# Patient Record
Sex: Male | Born: 1945
Health system: Southern US, Community
[De-identification: ages and names within clinical notes are randomized; demographics above are authoritative.]

## PROBLEM LIST (undated history)

## (undated) DIAGNOSIS — R112 Nausea with vomiting, unspecified: Secondary | ICD-10-CM

## (undated) DIAGNOSIS — T8859XA Other complications of anesthesia, initial encounter: Secondary | ICD-10-CM

## (undated) DIAGNOSIS — Z9889 Other specified postprocedural states: Secondary | ICD-10-CM

## (undated) DIAGNOSIS — J449 Chronic obstructive pulmonary disease, unspecified: Secondary | ICD-10-CM

## (undated) DIAGNOSIS — E119 Type 2 diabetes mellitus without complications: Secondary | ICD-10-CM

## (undated) DIAGNOSIS — G510 Bell's palsy: Secondary | ICD-10-CM

## (undated) DIAGNOSIS — T4145XA Adverse effect of unspecified anesthetic, initial encounter: Secondary | ICD-10-CM

## (undated) DIAGNOSIS — E039 Hypothyroidism, unspecified: Secondary | ICD-10-CM

## (undated) DIAGNOSIS — R000 Tachycardia, unspecified: Secondary | ICD-10-CM

## (undated) HISTORY — PX: DENTAL SURGERY: SHX609

## (undated) HISTORY — PX: COLONOSCOPY W/ POLYPECTOMY: SHX1380

## (undated) HISTORY — PX: EYE SURGERY: SHX253

## (undated) HISTORY — PX: OTHER SURGICAL HISTORY: SHX169

## (undated) HISTORY — PX: COLONOSCOPY: SHX174

---

## 2006-08-28 ENCOUNTER — Encounter: Admission: RE | Admit: 2006-08-28 | Discharge: 2006-08-28 | Payer: Self-pay | Admitting: Internal Medicine

## 2007-04-01 ENCOUNTER — Encounter (INDEPENDENT_AMBULATORY_CARE_PROVIDER_SITE_OTHER): Payer: Self-pay | Admitting: Diagnostic Radiology

## 2007-04-01 ENCOUNTER — Ambulatory Visit (HOSPITAL_COMMUNITY): Admission: RE | Admit: 2007-04-01 | Discharge: 2007-04-01 | Payer: Self-pay | Admitting: Urology

## 2009-02-14 ENCOUNTER — Encounter: Admission: RE | Admit: 2009-02-14 | Discharge: 2009-02-14 | Payer: Self-pay | Admitting: Internal Medicine

## 2009-02-14 IMAGING — US US ABDOMEN COMPLETE
1 series · 14 of 25 positions shown · non-contrast
Comparison: None

CLINICAL DATA: Abdominal pain.  Question gallbladder disease.
Known renal cyst.

COMPLETE ABDOMINAL ULTRASOUND

[Series 1: us abdomen complete · 0.37mm/px · 14 of 78 slices shown]
[im 1/78]
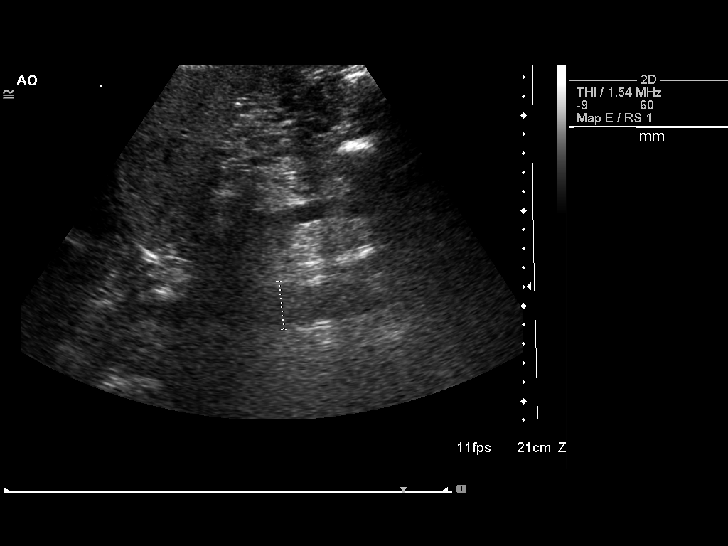
[im 7/78]
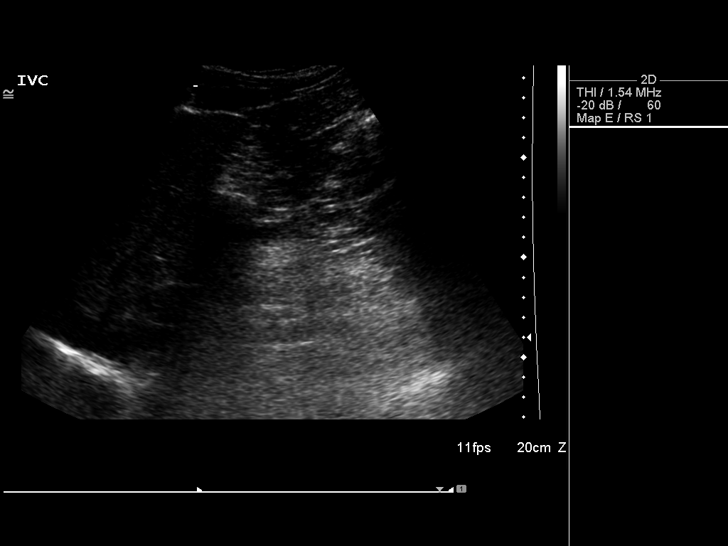
[im 13/78]
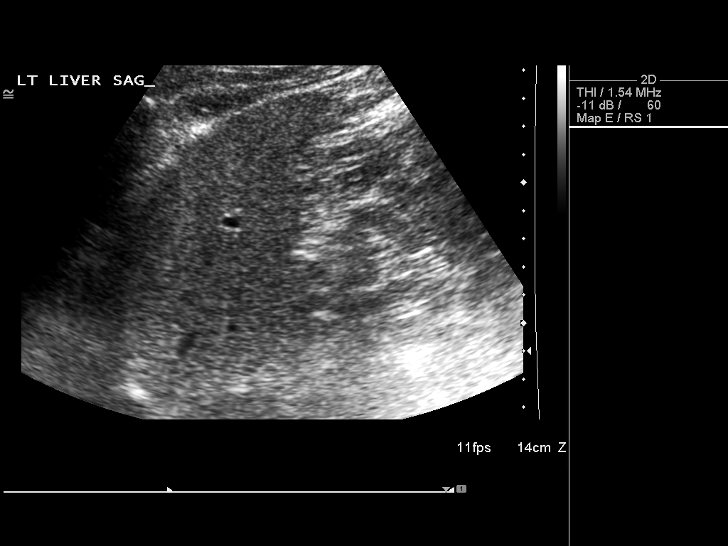
[im 20/78]
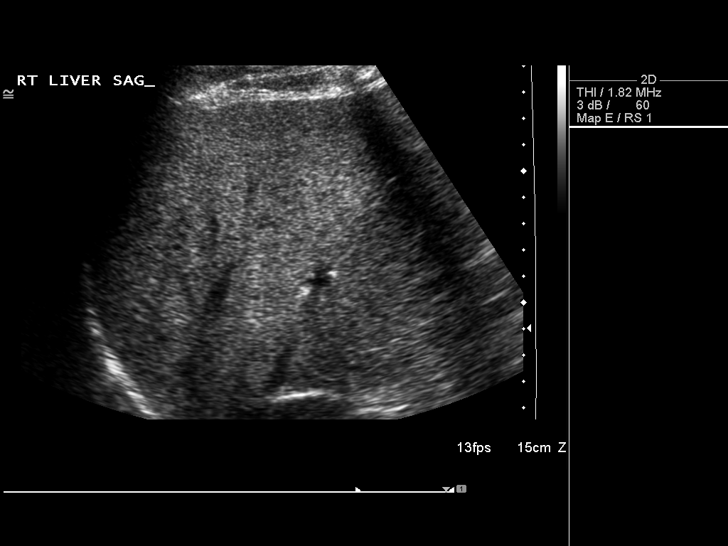
[im 26/78]
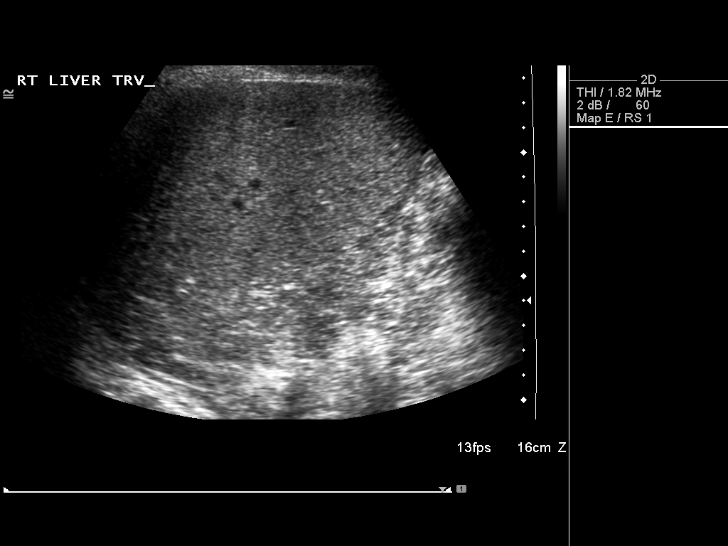
[im 29/78]
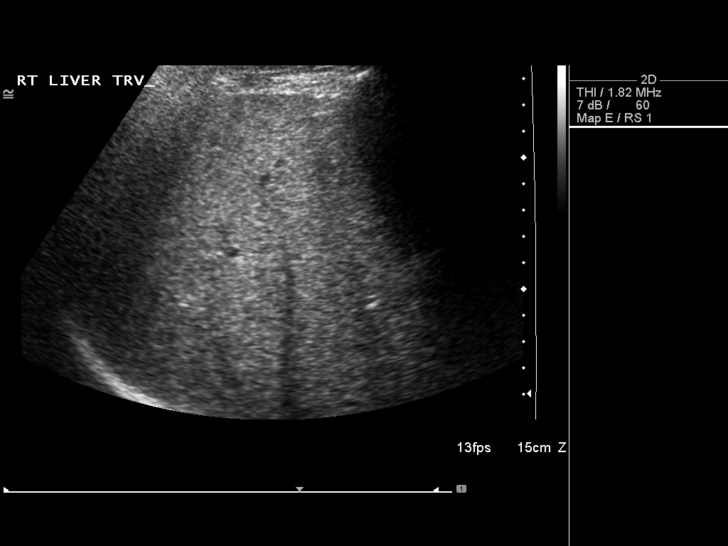
[im 36/78]
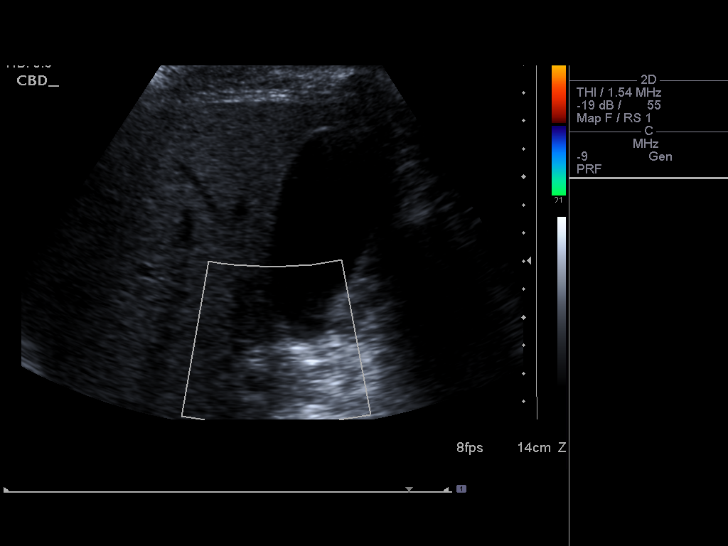
[im 42/78]
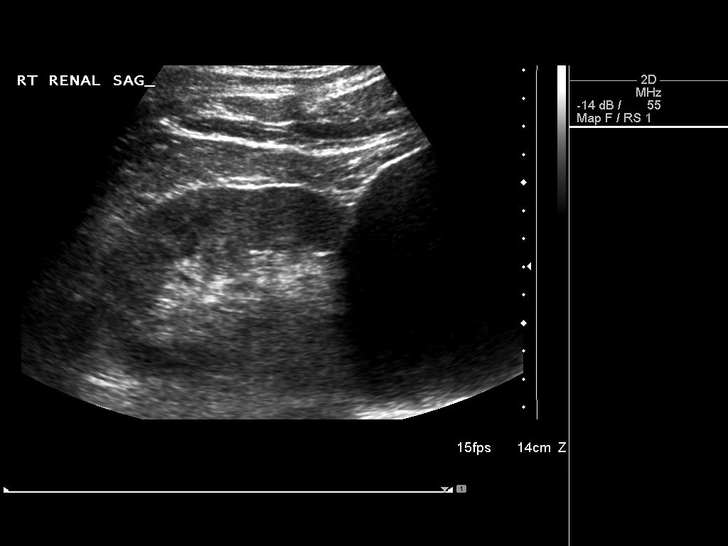
[im 49/78]
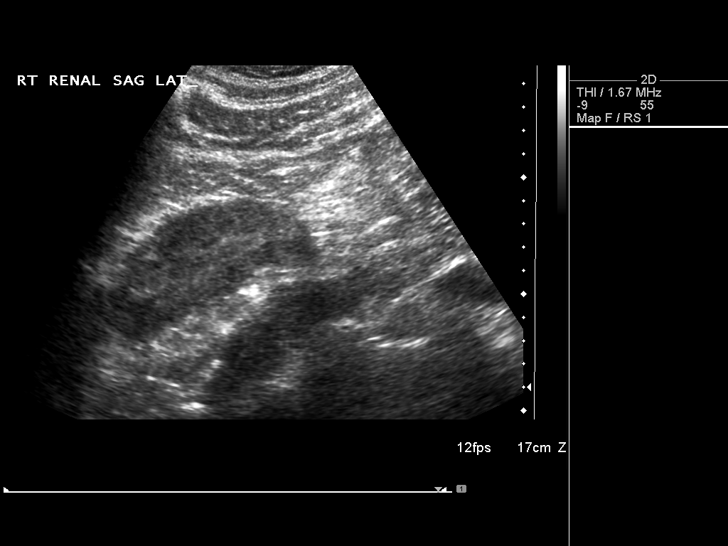
[im 52/78]
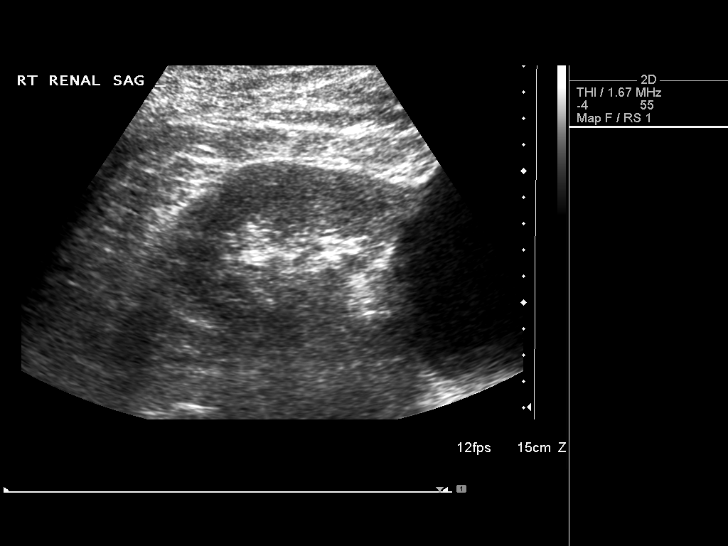
[im 58/78]
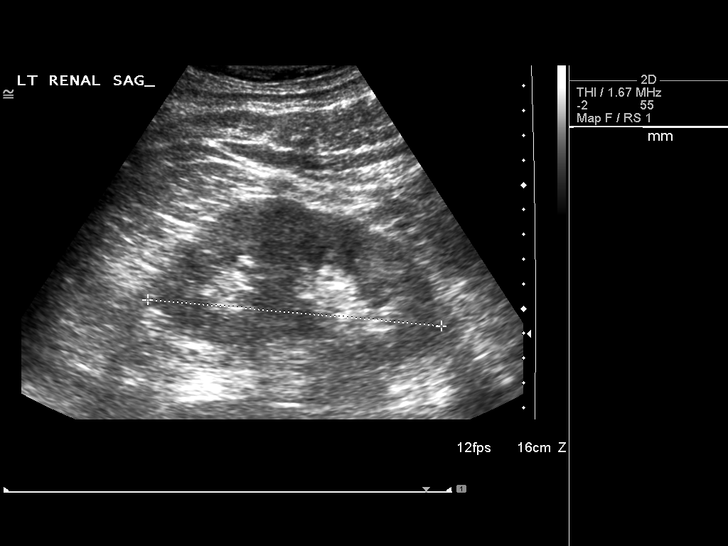
[im 65/78]
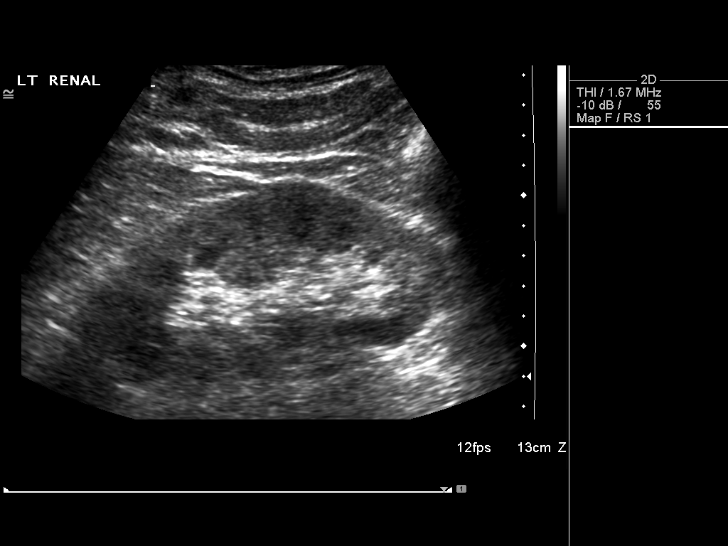
[im 71/78]
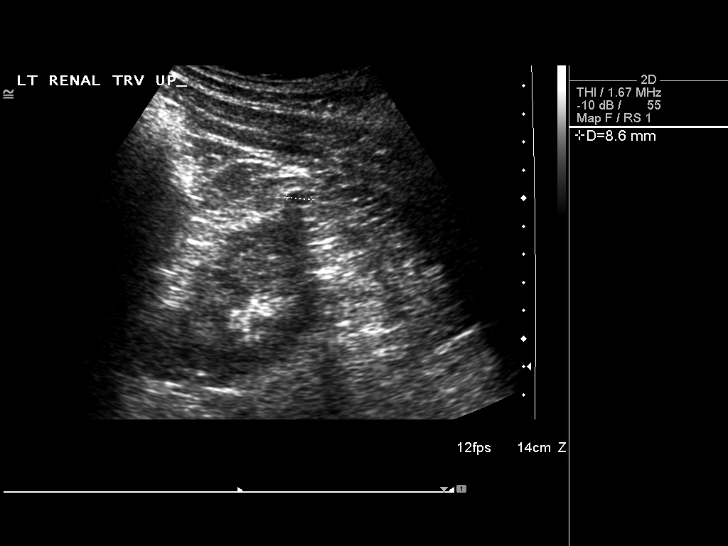
[im 78/78]
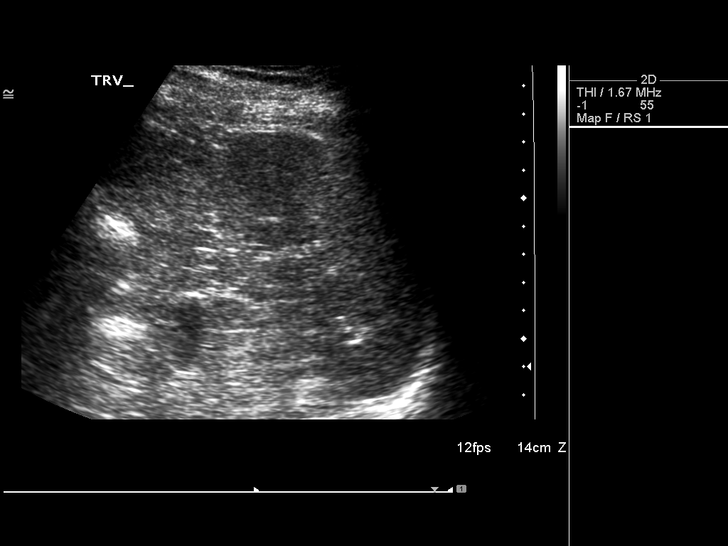

[14 of 25 positions shown; findings below may reference images not displayed]

FINDINGS: Normal gallbladder.  Gallbladder wall thickness is
mm.  Common duct measures 3.0 mm in diameter.  No hepatic, splenic,
or pancreatic abnormality.  Spleen measures 8.5 cm in length.  The
right and left kidneys measure 11.4 cm and 12.5 cm in length,
respectively.  8.5 x 9.7 x 7.9 cm cyst in the lower pole of the
right kidney.  This compares to 8.6 cm in diameter on the
[DATE] exam.  [DATE] x 2.1 x 1.9 cm upper pole cyst and 0.9 x
x 0.9 cm upper pole cyst of the left kidney.  Patent IVC.  Maximal
diameter abdominal aorta is 2.6 cm.
IMPRESSION: Normal gallbladder.  No biliary ductal dilatation.  Stable
bilateral renal cysts.

## 2009-04-27 ENCOUNTER — Ambulatory Visit (HOSPITAL_COMMUNITY): Admission: RE | Admit: 2009-04-27 | Discharge: 2009-04-27 | Payer: Self-pay | Admitting: Urology

## 2010-10-27 ENCOUNTER — Encounter: Payer: Self-pay | Admitting: Internal Medicine

## 2011-01-12 LAB — CBC
HCT: 47.1 % (ref 39.0–52.0)
Hemoglobin: 16.1 g/dL (ref 13.0–17.0)
MCHC: 34.2 g/dL (ref 30.0–36.0)
MCV: 86.7 fL (ref 78.0–100.0)
Platelets: 314 10*3/uL (ref 150–400)
RBC: 5.43 MIL/uL (ref 4.22–5.81)
RDW: 13.3 % (ref 11.5–15.5)
WBC: 9.2 10*3/uL (ref 4.0–10.5)

## 2011-07-23 LAB — CBC
HCT: 47.2
Hemoglobin: 16.4
MCHC: 34.8
MCV: 85
Platelets: 321
RBC: 5.56
RDW: 13.2
WBC: 8.8

## 2016-03-11 DIAGNOSIS — J209 Acute bronchitis, unspecified: Secondary | ICD-10-CM | POA: Diagnosis not present

## 2016-03-11 DIAGNOSIS — Z6831 Body mass index (BMI) 31.0-31.9, adult: Secondary | ICD-10-CM | POA: Diagnosis not present

## 2016-05-29 DIAGNOSIS — E668 Other obesity: Secondary | ICD-10-CM | POA: Diagnosis not present

## 2016-05-29 DIAGNOSIS — N529 Male erectile dysfunction, unspecified: Secondary | ICD-10-CM | POA: Diagnosis not present

## 2016-05-29 DIAGNOSIS — G510 Bell's palsy: Secondary | ICD-10-CM | POA: Diagnosis not present

## 2016-05-29 DIAGNOSIS — E119 Type 2 diabetes mellitus without complications: Secondary | ICD-10-CM | POA: Diagnosis not present

## 2016-11-24 DIAGNOSIS — E038 Other specified hypothyroidism: Secondary | ICD-10-CM | POA: Diagnosis not present

## 2016-11-24 DIAGNOSIS — Z125 Encounter for screening for malignant neoplasm of prostate: Secondary | ICD-10-CM | POA: Diagnosis not present

## 2016-11-24 DIAGNOSIS — E119 Type 2 diabetes mellitus without complications: Secondary | ICD-10-CM | POA: Diagnosis not present

## 2016-11-24 DIAGNOSIS — Z0000 Encounter for general adult medical examination without abnormal findings: Secondary | ICD-10-CM | POA: Diagnosis not present

## 2016-12-01 DIAGNOSIS — Z0000 Encounter for general adult medical examination without abnormal findings: Secondary | ICD-10-CM | POA: Diagnosis not present

## 2016-12-01 DIAGNOSIS — Z1389 Encounter for screening for other disorder: Secondary | ICD-10-CM | POA: Diagnosis not present

## 2016-12-01 DIAGNOSIS — N529 Male erectile dysfunction, unspecified: Secondary | ICD-10-CM | POA: Diagnosis not present

## 2016-12-01 DIAGNOSIS — G510 Bell's palsy: Secondary | ICD-10-CM | POA: Diagnosis not present

## 2016-12-01 DIAGNOSIS — E7800 Pure hypercholesterolemia, unspecified: Secondary | ICD-10-CM | POA: Diagnosis not present

## 2016-12-01 DIAGNOSIS — E668 Other obesity: Secondary | ICD-10-CM | POA: Diagnosis not present

## 2016-12-03 DIAGNOSIS — Z1212 Encounter for screening for malignant neoplasm of rectum: Secondary | ICD-10-CM | POA: Diagnosis not present

## 2017-06-03 DIAGNOSIS — E7800 Pure hypercholesterolemia, unspecified: Secondary | ICD-10-CM | POA: Diagnosis not present

## 2017-06-03 DIAGNOSIS — E119 Type 2 diabetes mellitus without complications: Secondary | ICD-10-CM | POA: Diagnosis not present

## 2017-06-03 DIAGNOSIS — E038 Other specified hypothyroidism: Secondary | ICD-10-CM | POA: Diagnosis not present

## 2017-06-03 DIAGNOSIS — J449 Chronic obstructive pulmonary disease, unspecified: Secondary | ICD-10-CM | POA: Diagnosis not present

## 2017-11-26 DIAGNOSIS — Z125 Encounter for screening for malignant neoplasm of prostate: Secondary | ICD-10-CM | POA: Diagnosis not present

## 2017-11-26 DIAGNOSIS — R82998 Other abnormal findings in urine: Secondary | ICD-10-CM | POA: Diagnosis not present

## 2017-11-26 DIAGNOSIS — E119 Type 2 diabetes mellitus without complications: Secondary | ICD-10-CM | POA: Diagnosis not present

## 2017-11-26 DIAGNOSIS — E038 Other specified hypothyroidism: Secondary | ICD-10-CM | POA: Diagnosis not present

## 2017-11-26 DIAGNOSIS — Z0000 Encounter for general adult medical examination without abnormal findings: Secondary | ICD-10-CM | POA: Diagnosis not present

## 2017-12-03 DIAGNOSIS — E668 Other obesity: Secondary | ICD-10-CM | POA: Diagnosis not present

## 2017-12-03 DIAGNOSIS — N528 Other male erectile dysfunction: Secondary | ICD-10-CM | POA: Diagnosis not present

## 2017-12-03 DIAGNOSIS — Z0000 Encounter for general adult medical examination without abnormal findings: Secondary | ICD-10-CM | POA: Diagnosis not present

## 2017-12-03 DIAGNOSIS — E038 Other specified hypothyroidism: Secondary | ICD-10-CM | POA: Diagnosis not present

## 2017-12-03 DIAGNOSIS — D126 Benign neoplasm of colon, unspecified: Secondary | ICD-10-CM | POA: Diagnosis not present

## 2017-12-03 DIAGNOSIS — Z1389 Encounter for screening for other disorder: Secondary | ICD-10-CM | POA: Diagnosis not present

## 2017-12-16 DIAGNOSIS — Z6831 Body mass index (BMI) 31.0-31.9, adult: Secondary | ICD-10-CM | POA: Diagnosis not present

## 2017-12-16 DIAGNOSIS — J441 Chronic obstructive pulmonary disease with (acute) exacerbation: Secondary | ICD-10-CM | POA: Diagnosis not present

## 2018-02-16 DIAGNOSIS — K5904 Chronic idiopathic constipation: Secondary | ICD-10-CM | POA: Diagnosis not present

## 2018-02-16 DIAGNOSIS — Z800 Family history of malignant neoplasm of digestive organs: Secondary | ICD-10-CM | POA: Diagnosis not present

## 2018-02-16 DIAGNOSIS — Z1211 Encounter for screening for malignant neoplasm of colon: Secondary | ICD-10-CM | POA: Diagnosis not present

## 2018-02-19 ENCOUNTER — Other Ambulatory Visit: Payer: Self-pay | Admitting: Gastroenterology

## 2018-02-19 DIAGNOSIS — Z800 Family history of malignant neoplasm of digestive organs: Secondary | ICD-10-CM | POA: Diagnosis not present

## 2018-02-19 DIAGNOSIS — K635 Polyp of colon: Secondary | ICD-10-CM | POA: Diagnosis not present

## 2018-02-19 DIAGNOSIS — Z1211 Encounter for screening for malignant neoplasm of colon: Secondary | ICD-10-CM | POA: Diagnosis not present

## 2018-02-19 DIAGNOSIS — R933 Abnormal findings on diagnostic imaging of other parts of digestive tract: Secondary | ICD-10-CM

## 2018-02-19 DIAGNOSIS — D124 Benign neoplasm of descending colon: Secondary | ICD-10-CM | POA: Diagnosis not present

## 2018-03-08 DIAGNOSIS — H401132 Primary open-angle glaucoma, bilateral, moderate stage: Secondary | ICD-10-CM | POA: Diagnosis not present

## 2018-03-25 ENCOUNTER — Other Ambulatory Visit: Payer: Self-pay

## 2018-03-25 ENCOUNTER — Ambulatory Visit
Admission: RE | Admit: 2018-03-25 | Discharge: 2018-03-25 | Disposition: A | Discharge status: Home / Self Care | Payer: BLUE CROSS/BLUE SHIELD | Source: Ambulatory Visit | Attending: Gastroenterology | Admitting: Gastroenterology

## 2018-03-25 DIAGNOSIS — N281 Cyst of kidney, acquired: Secondary | ICD-10-CM | POA: Diagnosis not present

## 2018-03-25 DIAGNOSIS — R933 Abnormal findings on diagnostic imaging of other parts of digestive tract: Secondary | ICD-10-CM

## 2018-03-25 MED ORDER — IOPAMIDOL (ISOVUE-300) INJECTION 61%
75.0000 mL | Freq: Once | INTRAVENOUS | Status: AC | PRN
Start: 2018-03-25 — End: 2018-03-25
  Administered 2018-03-25: 75 mL via INTRAVENOUS

## 2018-04-12 DIAGNOSIS — H40003 Preglaucoma, unspecified, bilateral: Secondary | ICD-10-CM | POA: Diagnosis not present

## 2018-04-12 DIAGNOSIS — H524 Presbyopia: Secondary | ICD-10-CM | POA: Diagnosis not present

## 2018-04-12 DIAGNOSIS — H401333 Pigmentary glaucoma, bilateral, severe stage: Secondary | ICD-10-CM | POA: Diagnosis not present

## 2018-04-12 DIAGNOSIS — H35372 Puckering of macula, left eye: Secondary | ICD-10-CM | POA: Diagnosis not present

## 2018-04-12 DIAGNOSIS — Z961 Presence of intraocular lens: Secondary | ICD-10-CM | POA: Diagnosis not present

## 2018-04-12 DIAGNOSIS — H5213 Myopia, bilateral: Secondary | ICD-10-CM | POA: Diagnosis not present

## 2018-04-12 DIAGNOSIS — H401133 Primary open-angle glaucoma, bilateral, severe stage: Secondary | ICD-10-CM | POA: Diagnosis not present

## 2018-04-14 ENCOUNTER — Other Ambulatory Visit: Payer: Self-pay | Admitting: Surgery

## 2018-04-14 DIAGNOSIS — K388 Other specified diseases of appendix: Secondary | ICD-10-CM | POA: Diagnosis not present

## 2018-05-10 DIAGNOSIS — K4090 Unilateral inguinal hernia, without obstruction or gangrene, not specified as recurrent: Secondary | ICD-10-CM | POA: Diagnosis not present

## 2018-05-10 DIAGNOSIS — K388 Other specified diseases of appendix: Secondary | ICD-10-CM | POA: Diagnosis not present

## 2018-05-10 DIAGNOSIS — J069 Acute upper respiratory infection, unspecified: Secondary | ICD-10-CM | POA: Diagnosis not present

## 2018-05-10 DIAGNOSIS — Z6830 Body mass index (BMI) 30.0-30.9, adult: Secondary | ICD-10-CM | POA: Diagnosis not present

## 2018-05-13 DIAGNOSIS — Z79899 Other long term (current) drug therapy: Secondary | ICD-10-CM | POA: Diagnosis not present

## 2018-05-13 DIAGNOSIS — H401333 Pigmentary glaucoma, bilateral, severe stage: Secondary | ICD-10-CM | POA: Diagnosis not present

## 2018-06-01 DIAGNOSIS — J449 Chronic obstructive pulmonary disease, unspecified: Secondary | ICD-10-CM | POA: Diagnosis not present

## 2018-06-01 DIAGNOSIS — G4733 Obstructive sleep apnea (adult) (pediatric): Secondary | ICD-10-CM | POA: Diagnosis not present

## 2018-06-01 DIAGNOSIS — E119 Type 2 diabetes mellitus without complications: Secondary | ICD-10-CM | POA: Diagnosis not present

## 2018-06-01 DIAGNOSIS — E038 Other specified hypothyroidism: Secondary | ICD-10-CM | POA: Diagnosis not present

## 2018-06-02 NOTE — Pre-Procedure Instructions (Signed)
William Dawson  06/02/2018      [facility] - [place], [place] - [address] [place] [address] Phone: [phone] Fax: [phone]    Your procedure is scheduled on   Thursday  06/10/18  Report to [facility] Admitting at 830 A.M.  Call this number if you have problems the morning of surgery:  [phone]   Remember:  Do not eat or drink after midnight.    Take these medicines the morning of surgery with A SIP OF WATER-   ATENOLOL (TENORMIN), SYMBICORT INHALER, LEVOTHYROXINE  7 days prior to surgery STOP taking any Aspirin(unless otherwise instructed by your surgeon), Aleve, Naproxen, Ibuprofen, Motrin, Advil, Goody's, BC's, all herbal medications, fish oil, and all vitamins      How to Manage Your Diabetes Before and After Surgery  Why is it important to control my blood sugar before and after surgery? . Improving blood sugar levels before and after surgery helps healing and can limit problems. . A way of improving blood sugar control is eating a healthy diet by: o  Eating less sugar and carbohydrates o  Increasing activity/exercise o  Talking with your doctor about reaching your blood sugar goals . High blood sugars (greater than 180 mg/dL) can raise your risk of infections and slow your recovery, so you will need to focus on controlling your diabetes during the weeks before surgery. . Make sure that the doctor who takes care of your diabetes knows about your planned surgery including the date and location.  How do I manage my blood sugar before surgery? . Check your blood sugar at least 4 times a day, starting 2 days before surgery, to make sure that the level is not too high or low. o Check your blood sugar the morning of your surgery when you wake up and every 2 hours until you get to the Short Stay unit. . If your blood sugar is less than 70 mg/dL, you will need to treat for low blood sugar: o Do not take  insulin. o Treat a low blood sugar (less than 70 mg/dL) with  cup of clear juice (cranberry or apple), 4 glucose tablets, OR glucose gel. Recheck blood sugar in 15 minutes after treatment (to make sure it is greater than 70 mg/dL). If your blood sugar is not greater than 70 mg/dL on recheck, call [phone] o  for further instructions. . Report your blood sugar to the short stay nurse when you get to Short Stay.  . If you are admitted to the hospital after surgery: o Your blood sugar will be checked by the staff and you will probably be given insulin after surgery (instead of oral diabetes medicines) to make sure you have good blood sugar levels. o The goal for blood sugar control after surgery is 80-180 mg/dL.  WHAT DO I DO ABOUT MY DIABETES MEDICATION?  Marland Kitchen Do not take oral diabetes medicines (pills) the morning of surgery.                Do not wear jewelry, make-up or nail polish.  Do not wear lotions, powders, or perfumes, or deodorant.  Do not shave 48 hours prior to surgery.  Men may shaveface and neck.  Do not bring valuables to the hospital.  [facility] is not responsible for any belongings or valuables.  Contacts, dentures or bridgework may not be worn into surgery.  Leave your suitcase in the car.  After surgery it may be brought to your room.  For patients admitted to the hospital, discharge time will be determined by your treatment team.  Patients discharged the day of surgery will not be allowed to drive home.    Name and phone number of your driver:    Special instructions:  [place] - Preparing for Surgery  Before surgery, you can play an important role.  Because skin is not sterile, your skin needs to be as free of germs as possible.  You can reduce the number of germs on you skin by washing with CHG (chlorahexidine gluconate) soap before surgery.  CHG is an antiseptic cleaner which kills germs and bonds with the skin to continue killing germs even after  washing.  Oral Hygiene is also important in reducing the risk of infection.  Remember to brush your teeth with your regular toothpaste the morning of surgery.  Please DO NOT use if you have an allergy to CHG or antibacterial soaps.  If your skin becomes reddened/irritated stop using the CHG and inform your nurse when you arrive at Short Stay.  Do not shave (including legs and underarms) for at least 48 hours prior to the first CHG shower.  You may shave your face.  Please follow these instructions carefully:   1.  Shower with CHG Soap the night before surgery and the morning of Surgery.  2.  If you choose to wash your hair, wash your hair first as usual with your normal shampoo.  3.  After you shampoo, rinse your hair and body thoroughly to remove the shampoo. 4.  Use CHG as you would any other liquid soap.  You can apply chg directly to the skin and wash gently with a      scrungie or washcloth.           5.  Apply the CHG Soap to your body ONLY FROM THE NECK DOWN.   Do not use on open wounds or open sores. Avoid contact with your eyes, ears, mouth and genitals (private parts).  Wash genitals (private parts) with your normal soap.  6.  Wash thoroughly, paying special attention to the area where your surgery will be performed.  7.  Thoroughly rinse your body with warm water from the neck down.  8.  DO NOT shower/wash with your normal soap after using and rinsing off the CHG Soap.  9.  Pat yourself dry with a clean towel.            10.  Wear clean pajamas.            11.  Place clean sheets on your bed the night of your first shower and do not sleep with pets.  Day of Surgery  Do not apply any lotions/deoderants the morning of surgery.   Please wear clean clothes to the hospital/surgery center. Remember to brush your teeth with toothpaste.     Please read over the following fact sheets that you were given. Pain Booklet and Surgical Site Infection Prevention

## 2018-06-03 ENCOUNTER — Encounter (HOSPITAL_COMMUNITY): Payer: Self-pay

## 2018-06-03 ENCOUNTER — Encounter (HOSPITAL_COMMUNITY)
Admission: RE | Admit: 2018-06-03 | Discharge: 2018-06-03 | Disposition: A | Discharge status: Home / Self Care | Payer: BLUE CROSS/BLUE SHIELD | Source: Ambulatory Visit | Attending: Surgery | Admitting: Surgery

## 2018-06-03 ENCOUNTER — Other Ambulatory Visit: Payer: Self-pay

## 2018-06-03 DIAGNOSIS — I452 Bifascicular block: Secondary | ICD-10-CM | POA: Diagnosis not present

## 2018-06-03 DIAGNOSIS — Z01812 Encounter for preprocedural laboratory examination: Secondary | ICD-10-CM | POA: Diagnosis not present

## 2018-06-03 DIAGNOSIS — E119 Type 2 diabetes mellitus without complications: Secondary | ICD-10-CM | POA: Diagnosis not present

## 2018-06-03 DIAGNOSIS — Z01810 Encounter for preprocedural cardiovascular examination: Secondary | ICD-10-CM | POA: Diagnosis not present

## 2018-06-03 HISTORY — DX: Adverse effect of unspecified anesthetic, initial encounter: T41.45XA

## 2018-06-03 HISTORY — DX: Nausea with vomiting, unspecified: Z98.890

## 2018-06-03 HISTORY — DX: Hypothyroidism, unspecified: E03.9

## 2018-06-03 HISTORY — DX: Other complications of anesthesia, initial encounter: T88.59XA

## 2018-06-03 HISTORY — DX: Type 2 diabetes mellitus without complications: E11.9

## 2018-06-03 HISTORY — DX: Chronic obstructive pulmonary disease, unspecified: J44.9

## 2018-06-03 HISTORY — DX: Bell's palsy: G51.0

## 2018-06-03 HISTORY — DX: Nausea with vomiting, unspecified: R11.2

## 2018-06-03 HISTORY — DX: Tachycardia, unspecified: R00.0

## 2018-06-03 LAB — CBC
HCT: 50.5 % (ref 39.0–52.0)
Hemoglobin: 16.3 g/dL (ref 13.0–17.0)
MCH: 28.5 pg (ref 26.0–34.0)
MCHC: 32.3 g/dL (ref 30.0–36.0)
MCV: 88.4 fL (ref 78.0–100.0)
Platelets: 263 10*3/uL (ref 150–400)
RBC: 5.71 MIL/uL (ref 4.22–5.81)
RDW: 13.4 % (ref 11.5–15.5)
WBC: 10.3 10*3/uL (ref 4.0–10.5)

## 2018-06-03 LAB — BASIC METABOLIC PANEL
Anion gap: 9 (ref 5–15)
BUN: 22 mg/dL (ref 8–23)
CO2: 24 mmol/L (ref 22–32)
Calcium: 9.6 mg/dL (ref 8.9–10.3)
Chloride: 109 mmol/L (ref 98–111)
Creatinine, Ser: 1.33 mg/dL — ABNORMAL HIGH (ref 0.61–1.24)
GFR calc Af Amer: >60 mL/min (ref >60)
GFR calc non Af Amer: 52 mL/min — ABNORMAL LOW (ref >60)
Glucose, Bld: 123 mg/dL — ABNORMAL HIGH (ref 70–99)
Potassium: 4.1 mmol/L (ref 3.5–5.1)
Sodium: 142 mmol/L (ref 135–145)

## 2018-06-03 LAB — GLUCOSE, CAPILLARY: Glucose-Capillary: 121 mg/dL — ABNORMAL HIGH (ref 70–99)

## 2018-06-03 NOTE — Pre-Procedure Instructions (Signed)
William Dawson  06/03/2018    Your procedure is scheduled on   Thursday  06/10/18  [facility] at 8: 30 A.M[facility]                Your surgery or procedure is scheduled for 10:30 AM   Call this number if you have problems the morning of surgery:  [phone]  Pre- op desk   Remember:  Do not eat after midnight Wednesday, September 4.         You  make have clear liquids until 7:30 AM   Clear Liquids are soda, fruit juice no pulp (no orange juice.), jello, coffee or tea no cream, plain broth, plain pop cycles (no fruit), Gatorade.    Take these medicines the morning of surgery with A SIP OF WATER-   ATENOLOL (TENORMIN), SYMBICORT INHALER, LEVOTHYROXINE  7 days prior to surgery STOP taking any Aspirin(unless otherwise instructed by your surgeon), Aleve, Naproxen, Ibuprofen, Motrin, Advil, Goody's, BC's, all herbal medications, fish oil, and all vitamins      How to Manage Your Diabetes Before and After Surgery  Why is it important to control my blood sugar before and after surgery? . Improving blood sugar levels before and after surgery helps healing and can limit problems. . A way of improving blood sugar control is eating a healthy diet by: o  Eating less sugar and carbohydrates o  Increasing activity/exercise o  Talking with your doctor about reaching your blood sugar goals . High blood sugars (greater than 180 mg/dL) can raise your risk of infections and slow your recovery, so you will need to focus on controlling your diabetes during the weeks before surgery. . Make sure that the doctor who takes care of your diabetes knows about your planned surgery including the date and location.  How do I manage my blood sugar before surgery? . Check your blood sugar at least 4 times a day, starting 2 days before surgery, to make sure that the level is not too high or low. o Check your blood sugar the morning of your surgery when you wake up and every 2 hours  until you get to the Short Stay unit. . If your blood sugar is less than 70 mg/dL, you will need to treat for low blood sugar: o Do not take insulin. o Treat a low blood sugar (less than 70 mg/dL) with  cup of clear juice (cranberry or apple), 4 glucose tablets, OR glucose gel. Recheck blood sugar in 15 minutes after treatment (to make sure it is greater than 70 mg/dL). If your blood sugar is not greater than 70 mg/dL on recheck, call [phone] o  for further instructions. . Report your blood sugar to the short stay nurse when you get to Short Stay.  . If you are admitted to the hospital after surgery: o Your blood sugar will be checked by the staff and you will probably be given insulin after surgery (instead of oral diabetes medicines) to make sure you have good blood sugar levels. o The goal for blood sugar control after surgery is 80-180 mg/dL.  WHAT DO I DO ABOUT MY DIABETES MEDICATION?  Marland Kitchen. Do not take oral diabetes medicines (pills) the morning of surgery.  Special instructions:  [place] - Preparing for Surgery  Before surgery, you can play an important role.  Because skin is not sterile, your skin needs to be as free of germs as possible.  You  can reduce the number of germs on you skin by washing with CHG (chlorahexidine gluconate) soap before surgery.  CHG is an antiseptic cleaner which kills germs and bonds with the skin to continue killing germs even after washing.  Oral Hygiene is also important in reducing the risk of infection.  Remember to brush your teeth with your regular toothpaste the morning of surgery.  Please DO NOT use if you have an allergy to CHG or antibacterial soaps.  If your skin becomes reddened/irritated stop using the CHG and inform your nurse when you arrive at Short Stay.  Do not shave (including legs and underarms) for at least 48 hours prior to the first CHG shower.  You may shave your face.  Please follow these instructions carefully:   1.  Shower  with CHG Soap the night before surgery and the morning of Surgery.  2.  If you choose to wash your hair, wash your hair first as usual with your normal shampoo.  3.  After you shampoo, rinse your hair and body thoroughly to remove the shampoo. Wash your face and private area with the soap you use at home, then rinse.  4.  Use CHG as you would any other liquid soap.  You can apply chg directly to the skin and wash gently with a scrungie or washcloth.            5.  Apply the CHG Soap to your body ONLY FROM THE NECK DOWN.   Do not use on open wounds or open sores. Avoid contact with your eyes, ears, mouth and genitals (private parts).     6.  Wash thoroughly, paying special attention to the area where your surgery will be performed.  7.  Thoroughly rinse your body with warm water from the neck down.  8.  DO NOT shower/wash with your normal soap after using and rinsing off the CHG Soap.  9.  Pat yourself dry with a clean towel.            10.  Wear clean pajamas.            11.  Place clean sheets on your bed the night of your first shower and do not sleep with pets.  Day of Surgery  Do not apply any lotions/deoderants the morning of surgery.   Please wear clean clothes to the hospital/surgery center. Remember to brush your teeth with toothpaste.   Do not wear jewelry, make-up or nail polish.  Do not wear lotions, powders, or perfumes, or deodorant.  Do not shave 48 hours prior to surgery.  Men may shaveface and neck.  Do not bring valuables to the hospital.  [facility] is not responsible for any belongings or valuables.  Contacts, dentures or bridgework may not be worn into surgery.  Leave your suitcase in the car.  After surgery it may be brought to your room.  For patients admitted to the hospital, discharge time will be determined by your treatment team.  Patients discharged the day of surgery will not be allowed to drive home.   Please read over the following fact sheets that you  were given. Pain Booklet and Surgical Site Infection Prevention

## 2018-06-04 ENCOUNTER — Encounter (HOSPITAL_COMMUNITY): Payer: Self-pay

## 2018-06-04 NOTE — Progress Notes (Addendum)
Anesthesia Chart Review:  Case:  [id number] Date/Time:  06/10/18 1015   Procedure:  APPENDECTOMY LAPAROSCOPIC ERAS PATHWAY (N/A )   Anesthesia type:  General   Pre-op diagnosis:  abnormal appendix   Location:  [facility] ROOM 09 / [facility] OR   Surgeon:  Abigail MiyamotoBlackman, Douglas, MD      DISCUSSION: Patient is a 72 year old male scheduled for the above. procedure. Ultrasound showed question appendiceal mucocele.  History includes former smoker (quit '81), post-operative N/V, tachycardia, DM2 (A1c 6.0), COPD, hypothyroidism, Bell's palsy '17.   PAT results showed Cr 1.33 and EKG with bifascicular block and negative T waves in V1-6 and inferior leads. Right BBB is new when compared [facility]11/[facility] but LAD and T wave inversion in V3-6 and inferior leads are old. Comparison BMET is still pending.  Primary change on EKG is new right BBB since 2014. No CV symptoms reported at PAT. By PCP notes, he is walking 3x/week. PCP evaluation earlier this week. Reviewed with anesthesiologist Arrie Aranurk, Stephen, MD. If remains asymptomatic from a CV standpoint and and otherwise no acute changes then it is anticipated that he can proceed as planned.     VS: BP (!) 143/80   Pulse 70   Temp 36.6 C   Resp 20   Ht 5\' 8"  (1.727 m)   Wt 90.1 kg   SpO2 95%   BMI 30.20 kg/m   PROVIDERS: Tisovec, Adelfa Kohichard W, MD is PCP. Last visit 06/01/18. He is aware of surgery plans. (Preoperative labs and 03/26/18 abdominal CT report routed to Dr. Wylene Simmerisovec.) Activity was documented as walking 3x/week.  Charna Elizabeth- Mann, Jyothi, MD is GI    LABS: Preoperative labs noted. Creatinine 1.33, BUN 22. A1c 6.0 on 06/01/18 ([facility]). Comparison BUN/Creatinine requested from PCP. [Update: No comparison labs received from PCP by 06/09/18 12:52 PM.] (all labs ordered are listed, but only abnormal results are displayed)  Labs Reviewed  GLUCOSE, CAPILLARY - Abnormal; Notable for the following components:      Result Value    Glucose-Capillary 121 (*)    All other components within normal limits  BASIC METABOLIC PANEL - Abnormal; Notable for the following components:   Glucose, Bld 123 (*)    Creatinine, Ser 1.33 (*)    GFR calc non Af Amer 52 (*)    All other components within normal limits  CBC    IMAGES: MRI head, MRA head/neck 12/11/15 [facility] Everywhere): MRI HEAD IMPRESSION: - Normal MRI of the brain for patient age. No acute intracranial infarct or other abnormality identified. MRA HEAD IMPRESSION: - Normal MRA of the intracranial circulation. MRA NECK IMPRESSION: - Normal MRA of the neck. No critical or high-grade flow-limiting stenosis identified within the major arterial vasculature of the neck.  EKG: 06/03/18: NSR, right BBB, LAFB, bifascicular block. Moderate voltage criteria for LVH, may be normal variant. Right BBB is new when compared to 09/15/13 tracing from [facility]. He had negative T waves in V3-6 and in inferior leads then as well. Negative T wave in V1-2 are new, but may be secondary to RBBB pattern.    CV: N/A  Past Medical History:  Diagnosis Date  . Bell's palsy 2015 ish  . Complication of anesthesia   . COPD (chronic obstructive pulmonary disease) (HCC)   . Diabetes mellitus without complication (HCC)    Type II  . Hypothyroidism   . PONV (postoperative nausea and vomiting)    x 1 witrh eye surgery  .  Tachycardia     Past Surgical History:  Procedure Laterality Date  . COLONOSCOPY     x 2 no polyps  . COLONOSCOPY W/ POLYPECTOMY    . DENTAL SURGERY    . EYE SURGERY Bilateral    cataract  . Needle aspiration of abdomen fluid Right    not a cyst - 2 times    MEDICATIONS: . loratadine (CLARITIN) 10 MG tablet  . atenolol (TENORMIN) 50 MG tablet  . budesonide-formoterol (SYMBICORT) 160-4.5 MCG/ACT inhaler  . Choline Fenofibrate 135 MG capsule  . levothyroxine (SYNTHROID, LEVOTHROID) 88 MCG tablet  . rosuvastatin (CRESTOR) 10 MG tablet  . Saxagliptin-Metformin  (KOMBIGLYZE XR) 02-999 MG TB24   No current facility-administered medications for this encounter.     Velna Ochs [facility] Short Stay Center/Anesthesiology Phone [phone] 06/04/2018 4:08 PM

## 2018-06-16 NOTE — H&P (Signed)
William Dawson  Location: [facility] Patient #: [id number] DOB: 1946-01-26 Married / Language: English / Race: White Male   History of Present Illness  The patient is a 72 year old male who presents with an appendix problem.   This patient is referred by Dr. Charna Elizabeth for evaluation of a mucocele of the appendix. He has undergone a screening colonoscopy when mucus material was seen coming from the appendiceal orifice. He is asymptomatic from this. He went on to have a CAT scan of the abdomen and pelvis showing enlarged thick walled appendix worrisome for a mucocele. He is otherwise without complaints. He has no abdominal pain. He denies fevers or chills. Bowel movements are normal.   Past Surgical History Colon Polyp Removal - Colonoscopy  Oral Surgery  Vasectomy   Allergies  No Known Drug Allergies [04/14/2018]:  Medication History  Atenolol (50MG  Tablet, Oral) Active. Crestor (10MG  Tablet, Oral) Active. Kombiglyze XR (5-1000MG  Tablet ER 24HR, Oral) Active. Symbicort (160-4.5MCG/ACT Aerosol, Inhalation) Active. Synthroid ( Tablet, Oral) Active. Ventolin HFA (108 (90 Base)MCG/ACT Aerosol Soln, Inhalation) Active. Timolol Maleate (0.5% Solution, Ophthalmic) Active. Viagra (100MG  Tablet, Oral) Active. Medications Reconciled  Social History Alcohol use  Occasional alcohol use. No caffeine use  No drug use  Tobacco use  Never smoker.  Family History  Cancer  Mother. Cerebrovascular Accident  Father, Mother. Colon Cancer  Mother. Thyroid problems  Mother.  Other Problems  Asthma  Diabetes Mellitus  Enlarged Prostate  Other disease, cancer, significant illness  Sleep Apnea  Thyroid Disease     Review of Systems  HEENT Present- Seasonal Allergies and Wears glasses/contact lenses. Not Present- Earache, Hearing Loss, Hoarseness, Nose Bleed, Oral Ulcers, Ringing in the Ears, Sinus Pain, Sore Throat, Visual Disturbances  and Yellow Eyes. Respiratory Present- Snoring. Not Present- Bloody sputum, Chronic Cough, Difficulty Breathing and Wheezing. Cardiovascular Present- Rapid Heart Rate. Not Present- Chest Pain, Difficulty Breathing Lying Down, Leg Cramps, Palpitations, Shortness of Breath and Swelling of Extremities. Gastrointestinal Present- Constipation. Not Present- Abdominal Pain, Bloating, Bloody Stool, Change in Bowel Habits, Chronic diarrhea, Difficulty Swallowing, Excessive gas, Gets full quickly at meals, Hemorrhoids, Indigestion, Nausea, Rectal Pain and Vomiting. Musculoskeletal Present- Joint Pain. Not Present- Back Pain, Joint Stiffness, Muscle Pain, Muscle Weakness and Swelling of Extremities. Hematology Present- Blood Thinners. Not Present- Easy Bruising, Excessive bleeding, Gland problems, HIV and Persistent Infections.  Vitals  Weight: 202.8 lb Height: 68in Body Surface Area: 2.06 m Body Mass Index: 30.84 kg/m  Pulse: 66 (Regular)  BP: 130/74 (Sitting, Left Arm, Standard)       Physical Exam  General Mental Status-Alert. General Appearance-Consistent with stated age. Hydration-Well hydrated. Voice-Normal.  Head and Neck Head-normocephalic, atraumatic with no lesions or palpable masses. Trachea-midline. Thyroid Gland Characteristics - normal size and consistency.  Eye Eyeball - Bilateral-Extraocular movements intact. Sclera/Conjunctiva - Bilateral-No scleral icterus.  Chest and Lung Exam Chest and lung exam reveals -quiet, even and easy respiratory effort with no use of accessory muscles and on auscultation, normal breath sounds, no adventitious sounds and normal vocal resonance. Inspection Chest Wall - Normal. Back - normal.  Breast Breast - Left-Symmetric, Non Tender, No Biopsy scars, no Dimpling, No Inflammation, No Lumpectomy scars, No Mastectomy scars, No Peau d' Orange. Breast - Right-Symmetric, Non Tender, No Biopsy scars, no Dimpling, No  Inflammation, No Lumpectomy scars, No Mastectomy scars, No Peau d' Orange. Breast Lump-No Palpable Breast Mass.  Cardiovascular Cardiovascular examination reveals -normal heart sounds, regular rate and rhythm with no murmurs and  normal pedal pulses bilaterally.  Abdomen Inspection Inspection of the abdomen reveals - No Hernias. Skin - Scar - no surgical scars. Palpation/Percussion Palpation and Percussion of the abdomen reveal - Soft, Non Tender, No Rebound tenderness, No Rigidity (guarding) and No hepatosplenomegaly. Auscultation Auscultation of the abdomen reveals - Bowel sounds normal.  Neurologic - Did not examine.  Musculoskeletal - Did not examine.  Lymphatic General Lymphatics - Did not examine. Head & Neck - Did not examine. Axillary - Did not examine. Femoral & Inguinal  Generalized Femoral & Inguinal Lymphatics: Bilateral - Description - Normal. Tenderness - Non Tender.    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 I have discussed the findings at colonoscopy and CAT scan with the patient in detail. A laparoscopic appendectomy is recommended for histologic evaluation of the appendix to rule out malignancy and to avoid the potential for appendicitis. I discussed the surgical procedure with him in detail. I discussed the risk of surgery which includes but is not limited to bleeding, infection, injury to surrounding structures, the need to convert to an open procedure, need for further procedures if malignancy is found, cardiopulmonary issues, up and still stump leak, DVT, etc. He understands and wishes to proceed with surgery

## 2018-06-16 NOTE — Anesthesia Preprocedure Evaluation (Addendum)
Anesthesia Evaluation  Patient identified by MRN, date of birth, ID band Patient awake    Reviewed: Allergy & Precautions, NPO status , Patient's Chart, lab work & pertinent test results  History of Anesthesia Complications (+) PONV and history of anesthetic complications  Airway Mallampati: II  TM Distance: >3 FB Neck ROM: Full    Dental  (+) Dental Advisory Given, Teeth Intact   Pulmonary COPD,  COPD inhaler, former smoker,    breath sounds clear to auscultation       Cardiovascular negative cardio ROS   Rhythm:Regular Rate:Normal     Neuro/Psych  Neuromuscular disease (Bell's palsy) negative psych ROS   GI/Hepatic Neg liver ROS,  "Abnormal appendix"   Endo/Other  diabetes, Type 2Hypothyroidism  Obesity   Renal/GU negative Renal ROS  negative genitourinary   Musculoskeletal negative musculoskeletal ROS (+)   Abdominal   Peds  Hematology negative hematology ROS (+)   Anesthesia Other Findings Glaucoma  Reproductive/Obstetrics                            Anesthesia Physical Anesthesia Plan  ASA: II  Anesthesia Plan: General   Post-op Pain Management:    Induction: Intravenous  PONV Risk Score and Plan: 4 or greater and Treatment may vary due to age or medical condition, Ondansetron, Propofol infusion and Dexamethasone  Airway Management Planned: Oral ETT  Additional Equipment: None  Intra-op Plan:   Post-operative Plan: Extubation in OR  Informed Consent: I have reviewed the patients History and Physical, chart, labs and discussed the procedure including the risks, benefits and alternatives for the proposed anesthesia with the patient or authorized representative who has indicated his/her understanding and acceptance.   Dental advisory given  Plan Discussed with: CRNA and Anesthesiologist  Anesthesia Plan Comments:        Anesthesia Quick Evaluation

## 2018-06-17 ENCOUNTER — Ambulatory Visit (HOSPITAL_COMMUNITY)
Admission: RE | Admit: 2018-06-17 | Discharge: 2018-06-17 | Disposition: A | Discharge status: Home / Self Care | Payer: BLUE CROSS/BLUE SHIELD | Source: Ambulatory Visit | Attending: Surgery | Admitting: Surgery

## 2018-06-17 ENCOUNTER — Encounter (HOSPITAL_COMMUNITY): Payer: Self-pay | Admitting: Certified Registered Nurse Anesthetist

## 2018-06-17 ENCOUNTER — Ambulatory Visit (HOSPITAL_COMMUNITY): Payer: BLUE CROSS/BLUE SHIELD | Admitting: Anesthesiology

## 2018-06-17 ENCOUNTER — Encounter (HOSPITAL_COMMUNITY)
Admission: RE | Disposition: A | Discharge status: Home / Self Care | Payer: Self-pay | Source: Ambulatory Visit | Attending: Surgery

## 2018-06-17 ENCOUNTER — Ambulatory Visit (HOSPITAL_COMMUNITY): Payer: BLUE CROSS/BLUE SHIELD | Admitting: Vascular Surgery

## 2018-06-17 DIAGNOSIS — E039 Hypothyroidism, unspecified: Secondary | ICD-10-CM | POA: Insufficient documentation

## 2018-06-17 DIAGNOSIS — Z87891 Personal history of nicotine dependence: Secondary | ICD-10-CM | POA: Insufficient documentation

## 2018-06-17 DIAGNOSIS — E119 Type 2 diabetes mellitus without complications: Secondary | ICD-10-CM | POA: Diagnosis not present

## 2018-06-17 DIAGNOSIS — Z7984 Long term (current) use of oral hypoglycemic drugs: Secondary | ICD-10-CM | POA: Diagnosis not present

## 2018-06-17 DIAGNOSIS — K388 Other specified diseases of appendix: Secondary | ICD-10-CM | POA: Insufficient documentation

## 2018-06-17 DIAGNOSIS — Z79890 Hormone replacement therapy (postmenopausal): Secondary | ICD-10-CM | POA: Insufficient documentation

## 2018-06-17 DIAGNOSIS — J449 Chronic obstructive pulmonary disease, unspecified: Secondary | ICD-10-CM | POA: Diagnosis not present

## 2018-06-17 DIAGNOSIS — H409 Unspecified glaucoma: Secondary | ICD-10-CM | POA: Insufficient documentation

## 2018-06-17 DIAGNOSIS — Z79899 Other long term (current) drug therapy: Secondary | ICD-10-CM | POA: Insufficient documentation

## 2018-06-17 DIAGNOSIS — K36 Other appendicitis: Secondary | ICD-10-CM | POA: Diagnosis not present

## 2018-06-17 DIAGNOSIS — E669 Obesity, unspecified: Secondary | ICD-10-CM | POA: Insufficient documentation

## 2018-06-17 DIAGNOSIS — G4730 Sleep apnea, unspecified: Secondary | ICD-10-CM | POA: Diagnosis not present

## 2018-06-17 DIAGNOSIS — Z6830 Body mass index (BMI) 30.0-30.9, adult: Secondary | ICD-10-CM | POA: Insufficient documentation

## 2018-06-17 HISTORY — PX: LAPAROSCOPIC APPENDECTOMY: SHX408

## 2018-06-17 LAB — GLUCOSE, CAPILLARY
Glucose-Capillary: 119 mg/dL — ABNORMAL HIGH (ref 70–99)
Glucose-Capillary: 143 mg/dL — ABNORMAL HIGH (ref 70–99)

## 2018-06-17 SURGERY — APPENDECTOMY, LAPAROSCOPIC
Anesthesia: General | Site: Abdomen

## 2018-06-17 MED ORDER — FENTANYL CITRATE (PF) 250 MCG/5ML IJ SOLN
INTRAMUSCULAR | Status: DC | PRN
  Administered 2018-06-17: 100 ug via INTRAVENOUS
  Administered 2018-06-17: 50 ug via INTRAVENOUS

## 2018-06-17 MED ORDER — EPHEDRINE SULFATE 50 MG/ML IJ SOLN
INTRAMUSCULAR | Status: DC | PRN
  Administered 2018-06-17 (×5): 10 mg via INTRAVENOUS

## 2018-06-17 MED ORDER — FENTANYL CITRATE (PF) 250 MCG/5ML IJ SOLN
INTRAMUSCULAR | Status: AC
  Filled 2018-06-17: qty 5

## 2018-06-17 MED ORDER — 0.9 % SODIUM CHLORIDE (POUR BTL) OPTIME
TOPICAL | Status: DC | PRN
  Administered 2018-06-17: 1000 mL

## 2018-06-17 MED ORDER — MIDAZOLAM HCL 2 MG/2ML IJ SOLN
INTRAMUSCULAR | Status: DC | PRN
  Administered 2018-06-17: 1 mg via INTRAVENOUS

## 2018-06-17 MED ORDER — MIDAZOLAM HCL 2 MG/2ML IJ SOLN
INTRAMUSCULAR | Status: AC
  Filled 2018-06-17: qty 2

## 2018-06-17 MED ORDER — ROCURONIUM BROMIDE 10 MG/ML (PF) SYRINGE
PREFILLED_SYRINGE | INTRAVENOUS | Status: DC | PRN
  Administered 2018-06-17: 50 mg via INTRAVENOUS

## 2018-06-17 MED ORDER — ONDANSETRON HCL 4 MG/2ML IJ SOLN
INTRAMUSCULAR | Status: AC
  Filled 2018-06-17: qty 2

## 2018-06-17 MED ORDER — FENTANYL CITRATE (PF) 100 MCG/2ML IJ SOLN
INTRAMUSCULAR | Status: AC
  Administered 2018-06-17: 25 ug via INTRAVENOUS
  Filled 2018-06-17: qty 2

## 2018-06-17 MED ORDER — OXYCODONE HCL 5 MG PO TABS
ORAL_TABLET | ORAL | Status: AC
  Filled 2018-06-17: qty 1

## 2018-06-17 MED ORDER — PROPOFOL 500 MG/50ML IV EMUL
INTRAVENOUS | Status: DC | PRN
  Administered 2018-06-17: 20 ug/kg/min via INTRAVENOUS

## 2018-06-17 MED ORDER — GABAPENTIN 300 MG PO CAPS
ORAL_CAPSULE | ORAL | Status: AC
  Administered 2018-06-17: 07:00:00
  Filled 2018-06-17: qty 1

## 2018-06-17 MED ORDER — LACTATED RINGERS IV SOLN
INTRAVENOUS | Status: DC | PRN
  Administered 2018-06-17: 07:00:00 via INTRAVENOUS

## 2018-06-17 MED ORDER — CEFAZOLIN SODIUM-DEXTROSE 2-4 GM/100ML-% IV SOLN
INTRAVENOUS | Status: AC
  Filled 2018-06-17: qty 100

## 2018-06-17 MED ORDER — ROCURONIUM BROMIDE 50 MG/5ML IV SOSY
PREFILLED_SYRINGE | INTRAVENOUS | Status: AC
  Filled 2018-06-17: qty 5

## 2018-06-17 MED ORDER — PROPOFOL 10 MG/ML IV BOLUS
INTRAVENOUS | Status: DC | PRN
  Administered 2018-06-17: 150 mg via INTRAVENOUS

## 2018-06-17 MED ORDER — DEXAMETHASONE SODIUM PHOSPHATE 10 MG/ML IJ SOLN
INTRAMUSCULAR | Status: AC
  Filled 2018-06-17: qty 1

## 2018-06-17 MED ORDER — PROPOFOL 10 MG/ML IV BOLUS
INTRAVENOUS | Status: AC
  Filled 2018-06-17: qty 20

## 2018-06-17 MED ORDER — CELECOXIB 200 MG PO CAPS
ORAL_CAPSULE | ORAL | Status: AC
  Administered 2018-06-17: 200 mg
  Filled 2018-06-17: qty 1

## 2018-06-17 MED ORDER — CEFAZOLIN SODIUM-DEXTROSE 2-3 GM-%(50ML) IV SOLR
INTRAVENOUS | Status: DC | PRN
  Administered 2018-06-17: 2 g via INTRAVENOUS

## 2018-06-17 MED ORDER — STERILE WATER FOR IRRIGATION IR SOLN
Status: DC | PRN
  Administered 2018-06-17: 1000 mL

## 2018-06-17 MED ORDER — LIDOCAINE 2% (20 MG/ML) 5 ML SYRINGE
INTRAMUSCULAR | Status: DC | PRN
  Administered 2018-06-17: 80 mg via INTRAVENOUS

## 2018-06-17 MED ORDER — BUPIVACAINE-EPINEPHRINE 0.5% -1:200000 IJ SOLN
INTRAMUSCULAR | Status: DC | PRN
  Administered 2018-06-17: 20 mL

## 2018-06-17 MED ORDER — DEXAMETHASONE SODIUM PHOSPHATE 10 MG/ML IJ SOLN
INTRAMUSCULAR | Status: DC | PRN
  Administered 2018-06-17: 5 mg via INTRAVENOUS

## 2018-06-17 MED ORDER — ONDANSETRON HCL 4 MG/2ML IJ SOLN: 4.0000 mg | Freq: Once | INTRAMUSCULAR | Status: DC | PRN

## 2018-06-17 MED ORDER — LIDOCAINE 2% (20 MG/ML) 5 ML SYRINGE
INTRAMUSCULAR | Status: AC
  Filled 2018-06-17: qty 5

## 2018-06-17 MED ORDER — ACETAMINOPHEN 500 MG PO TABS
ORAL_TABLET | ORAL | Status: AC
  Administered 2018-06-17: 1000 mg
  Filled 2018-06-17: qty 2

## 2018-06-17 MED ORDER — EPHEDRINE 5 MG/ML INJ
INTRAVENOUS | Status: AC
  Filled 2018-06-17: qty 10

## 2018-06-17 MED ORDER — SUGAMMADEX SODIUM 200 MG/2ML IV SOLN
INTRAVENOUS | Status: DC | PRN
  Administered 2018-06-17 (×2): 100 mg via INTRAVENOUS

## 2018-06-17 MED ORDER — SODIUM CHLORIDE 0.9 % IR SOLN
Status: DC | PRN
  Administered 2018-06-17: 1000 mL

## 2018-06-17 MED ORDER — ONDANSETRON HCL 4 MG/2ML IJ SOLN
INTRAMUSCULAR | Status: DC | PRN
  Administered 2018-06-17: 4 mg via INTRAVENOUS

## 2018-06-17 MED ORDER — BUPIVACAINE-EPINEPHRINE (PF) 0.25% -1:200000 IJ SOLN
INTRAMUSCULAR | Status: AC
  Filled 2018-06-17: qty 30

## 2018-06-17 MED ORDER — FENTANYL CITRATE (PF) 100 MCG/2ML IJ SOLN
25.0000 ug | INTRAMUSCULAR | Status: DC | PRN
  Administered 2018-06-17 (×2): 25 ug via INTRAVENOUS
  Administered 2018-06-17: 50 ug via INTRAVENOUS

## 2018-06-17 MED ORDER — OXYCODONE HCL 5 MG/5ML PO SOLN: 5.0000 mg | Freq: Once | ORAL | Status: AC | PRN

## 2018-06-17 MED ORDER — OXYCODONE HCL 5 MG PO TABS: 5.0000 mg | ORAL_TABLET | Freq: Four times a day (QID) | ORAL | 0 refills | Status: DC | PRN

## 2018-06-17 MED ORDER — BUPIVACAINE-EPINEPHRINE (PF) 0.5% -1:200000 IJ SOLN
INTRAMUSCULAR | Status: AC
  Filled 2018-06-17: qty 30

## 2018-06-17 MED ORDER — OXYCODONE HCL 5 MG PO TABS
5.0000 mg | ORAL_TABLET | Freq: Once | ORAL | Status: AC | PRN
  Administered 2018-06-17: 5 mg via ORAL

## 2018-06-17 SURGICAL SUPPLY — 37 items
APPLIER CLIP 5 13 M/L LIGAMAX5 (MISCELLANEOUS)
APPLIER CLIP ROT 10 11.4 M/L (STAPLE)
CANISTER SUCT 3000ML PPV (MISCELLANEOUS) ×2 IMPLANT
CHLORAPREP W/TINT 26ML (MISCELLANEOUS) ×2 IMPLANT
CLIP APPLIE 5 13 M/L LIGAMAX5 (MISCELLANEOUS) IMPLANT
CLIP APPLIE ROT 10 11.4 M/L (STAPLE) IMPLANT
COVER SURGICAL LIGHT HANDLE (MISCELLANEOUS) ×2 IMPLANT
CUTTER FLEX LINEAR 45M (STAPLE) IMPLANT
DERMABOND ADVANCED (GAUZE/BANDAGES/DRESSINGS) ×1
DERMABOND ADVANCED .7 DNX12 (GAUZE/BANDAGES/DRESSINGS) ×1 IMPLANT
ELECT REM PT RETURN 9FT ADLT (ELECTROSURGICAL) ×2
ELECTRODE REM PT RTRN 9FT ADLT (ELECTROSURGICAL) ×1 IMPLANT
GLOVE SURG SIGNA 7.5 PF LTX (GLOVE) ×2 IMPLANT
GOWN STRL REUS W/ TWL LRG LVL3 (GOWN DISPOSABLE) ×1 IMPLANT
GOWN STRL REUS W/ TWL XL LVL3 (GOWN DISPOSABLE) ×2 IMPLANT
GOWN STRL REUS W/TWL LRG LVL3 (GOWN DISPOSABLE) ×1
GOWN STRL REUS W/TWL XL LVL3 (GOWN DISPOSABLE) ×2
KIT BASIN OR (CUSTOM PROCEDURE TRAY) ×2 IMPLANT
KIT TURNOVER KIT B (KITS) ×2 IMPLANT
NS IRRIG 1000ML POUR BTL (IV SOLUTION) ×2 IMPLANT
PAD ARMBOARD 7.5X6 YLW CONV (MISCELLANEOUS) ×4 IMPLANT
POUCH SPECIMEN RETRIEVAL 10MM (ENDOMECHANICALS) ×2 IMPLANT
RELOAD 45 VASCULAR/THIN (ENDOMECHANICALS) IMPLANT
RELOAD STAPLE TA45 3.5 REG BLU (ENDOMECHANICALS) ×2 IMPLANT
SCISSORS LAP 5X35 DISP (ENDOMECHANICALS) ×2 IMPLANT
SET IRRIG TUBING LAPAROSCOPIC (IRRIGATION / IRRIGATOR) ×2 IMPLANT
SHEARS HARMONIC ACE PLUS 36CM (ENDOMECHANICALS) ×2 IMPLANT
SLEEVE ENDOPATH XCEL 5M (ENDOMECHANICALS) ×2 IMPLANT
SPECIMEN JAR SMALL (MISCELLANEOUS) ×2 IMPLANT
SUT MON AB 4-0 PC3 18 (SUTURE) ×2 IMPLANT
TOWEL OR 17X24 6PK STRL BLUE (TOWEL DISPOSABLE) ×2 IMPLANT
TOWEL OR 17X26 10 PK STRL BLUE (TOWEL DISPOSABLE) ×2 IMPLANT
TRAY LAPAROSCOPIC MC (CUSTOM PROCEDURE TRAY) ×2 IMPLANT
TROCAR XCEL BLUNT TIP 100MML (ENDOMECHANICALS) ×2 IMPLANT
TROCAR XCEL NON-BLD 5MMX100MML (ENDOMECHANICALS) ×2 IMPLANT
TUBING INSUFFLATION (TUBING) ×2 IMPLANT
WATER STERILE IRR 1000ML POUR (IV SOLUTION) ×2 IMPLANT

## 2018-06-17 NOTE — Interval H&P Note (Signed)
History and Physical Interval Note: no change in H and P  06/17/2018 6:51 AM  William Dawson  has presented today for surgery, with the diagnosis of abnormal appendix  The various methods of treatment have been discussed with the patient and family. After consideration of risks, benefits and other options for treatment, the patient has consented to  Procedure(s): APPENDECTOMY LAPAROSCOPIC ERAS PATHWAY (N/A) as a surgical intervention .  The patient's history has been reviewed, patient examined, no change in status, stable for surgery.  I have reviewed the patient's chart and labs.  Questions were answered to the patient's satisfaction.     [name]

## 2018-06-17 NOTE — Transfer of Care (Signed)
Immediate Anesthesia Transfer of Care Note  Patient: William Dawson  Procedure(s) Performed: LAPAROSCOPIC APPENDECTOMY (N/A Abdomen)  Patient Location: PACU  Anesthesia Type:General  Level of Consciousness: drowsy and responds to stimulation  Airway & Oxygen Therapy: Patient Spontanous Breathing and Patient connected to nasal cannula oxygen  Post-op Assessment: Report given to RN and Post -op Vital signs reviewed and stable  Post vital signs: Reviewed and stable  Last Vitals:  Vitals Value Taken Time  BP 109/63 06/17/2018  8:54 AM  Temp    Pulse 60 06/17/2018  8:55 AM  Resp 19 06/17/2018  8:55 AM  SpO2 89 % 06/17/2018  8:55 AM  Vitals shown include unvalidated device data.  Last Pain:  Vitals:   06/17/18 0702  TempSrc:   PainSc: 0-No pain      Patients Stated Pain Goal: 1 (06/17/18 [id number])  Complications: No apparent anesthesia complications

## 2018-06-17 NOTE — Discharge Instructions (Signed)
[facility] ______[facility] °LAPAROSCOPIC SURGERY: POST OP INSTRUCTIONS °Always review your discharge instruction sheet given to you by the facility where your surgery was performed. °IF YOU HAVE DISABILITY OR FAMILY LEAVE FORMS, YOU MUST BRING THEM TO THE OFFICE FOR PROCESSING.   °DO NOT GIVE THEM TO YOUR DOCTOR. ° °1. A prescription for pain medication may be given to you upon discharge.  Take your pain medication as prescribed, if needed.  If narcotic pain medicine is not needed, then you may take acetaminophen (Tylenol) or ibuprofen (Advil) as needed. °2. Take your usually prescribed medications unless otherwise directed. °3. If you need a refill on your pain medication, please contact your pharmacy.  They will contact our office to request authorization. Prescriptions will not be filled after 5pm or on week-ends. °4. You should follow a light diet the first few days after arrival home, such as soup and crackers, etc.  Be sure to include lots of fluids daily. °5. Most patients will experience some swelling and bruising in the area of the incisions.  Ice packs will help.  Swelling and bruising can take several days to resolve.  °6. It is common to experience some constipation if taking pain medication after surgery.  Increasing fluid intake and taking a stool softener (such as Colace) will usually help or prevent this problem from occurring.  A mild laxative (Milk of Magnesia or Miralax) should be taken according to package instructions if there are no bowel movements after 48 hours. °7. Unless discharge instructions indicate otherwise, you may remove your bandages 24-48 hours after surgery, and you may shower at that time.  You may have steri-strips (small skin tapes) in place directly over the incision.  These strips should be left on the skin for 7-10 days.  If your surgeon used skin glue on the incision, you may shower in 24 hours.  The glue will flake off over the next 2-3 weeks.  Any sutures or  staples will be removed at the office during your follow-up visit. °8. ACTIVITIES:  You may resume regular (light) daily activities beginning the next day--such as daily self-care, walking, climbing stairs--gradually increasing activities as tolerated.  You may have sexual intercourse when it is comfortable.  Refrain from any heavy lifting or straining until approved by your doctor. °a. You may drive when you are no longer taking prescription pain medication, you can comfortably wear a seatbelt, and you can safely maneuver your car and apply brakes. °b. RETURN TO WORK:  __________________________________________________________ °9. You should see your doctor in the office for a follow-up appointment approximately 2-3 weeks after your surgery.  Make sure that you call for this appointment within a day or two after you arrive home to insure a convenient appointment time. °10. OTHER INSTRUCTIONS:OK TO SHOWER STARTING TOMORROW °11. ICE PACK, TYLENOL, IBUPROFEN ALSO FOR PAIN °12. NO LIFTING MORE THAN 15 TO 20 POUNDS FOR 2 WEEKS __________________________________________________________________________________________________________________________ __________________________________________________________________________________________________________________________ °WHEN TO CALL YOUR DOCTOR: °1. Fever over 101.0 °2. Inability to urinate °3. Continued bleeding from incision. °4. Increased pain, redness, or drainage from the incision. °5. Increasing abdominal pain ° °The clinic staff is available to answer your questions during regular business hours.  Please don’t hesitate to call and ask to speak to one of the nurses for clinical concerns.  If you have a medical emergency, go to the nearest emergency room or call 911.  A surgeon from [facility] is always on call at the hospital. °[address], [address], [place], [place]  [address] ? [address] [address], [place], [place]   [address] °[phone] ?  [phone] ? FAX [phone] °Web site: [email or web address] °

## 2018-06-17 NOTE — Anesthesia Postprocedure Evaluation (Addendum)
Anesthesia Post Note  Patient: William Dawson  Procedure(s) Performed: LAPAROSCOPIC APPENDECTOMY (N/A Abdomen)     Patient location during evaluation: PACU Anesthesia Type: General Level of consciousness: awake and alert Pain management: pain level controlled Vital Signs Assessment: post-procedure vital signs reviewed and stable Respiratory status: spontaneous breathing, nonlabored ventilation and respiratory function stable Cardiovascular status: blood pressure returned to baseline and stable Postop Assessment: no apparent nausea or vomiting Anesthetic complications: no    Last Vitals:  Vitals:   06/17/18 0956 06/17/18 1000  BP:  114/71  Pulse: 62 (!) 58  Resp: 15 12  Temp:    SpO2: 100% 100%    Last Pain:  Vitals:   06/17/18 0956  TempSrc:   PainSc: 7                  Beryle Lathehomas E Brock

## 2018-06-17 NOTE — Anesthesia Procedure Notes (Signed)
Procedure Name: Intubation Date/Time: 06/17/2018 7:38 AM Performed by: White, Amedeo Plenty, CRNA Pre-anesthesia Checklist: Patient identified, Emergency Drugs available, Suction available and Patient being monitored Patient Re-evaluated:Patient Re-evaluated prior to induction Oxygen Delivery Method: Circle System Utilized Preoxygenation: Pre-oxygenation with 100% oxygen Induction Type: IV induction Ventilation: Mask ventilation without difficulty Laryngoscope Size: Mac and 4 Grade View: Grade I Tube type: Oral Tube size: 7.5 mm Number of attempts: 1 Airway Equipment and Method: Stylet and Oral airway Placement Confirmation: ETT inserted through vocal cords under direct vision,  positive ETCO2 and breath sounds checked- equal and bilateral Secured at: 24 cm Tube secured with: Tape Dental Injury: Teeth and Oropharynx as per pre-operative assessment

## 2018-06-17 NOTE — Op Note (Signed)
LAPAROSCOPIC APPENDECTOMY  Procedure Note  William Dawson 06/17/2018   Pre-op Diagnosis: ABNORMAL APPENDIX     Post-op Diagnosis: same  Procedure(s): LAPAROSCOPIC APPENDECTOMY  Surgeon(s): Abigail MiyamotoBlackman, Ayodele Sangalang, MD  Anesthesia: General  Staff:  Circulator: Sofie Rowerove, Megan C, RN Scrub Person: Rogers SeedsHall, Amy T, RN; Orbie HurstMcGranahan, Jamie N, RN Circulator Assistant: Vivia Ewingrutchfield, Elizabeth, RN; Margo AyeHall, Amy T, RN  Estimated Blood Loss: Minimal               Specimens: sent to path  Indications: This is a 72 year old gentleman who was seen on a recent colonoscopy to have mucus coming from the appendiceal orifice.  He underwent a CT scan showing a dilated appendix.  This was worrisome for a possible mucocele.  The decision was made to proceed with an appendectomy  Findings: The patient had a mild to moderate amount of intra-abdominal adhesions.  The appendix was retrocecal and was completely contracted past the base and was firm and calcified having the appearance of a possible previous perforation.  I was only able to remove it piecemeal but was able to transect the base.  No other intra-abdominal pathology was identified  Procedure: The patient was brought to the operating room and identified as correct patient.  He was placed supine on the operating room table and general anesthesia was induced.  His abdomen was then prepped and draped in the usual sterile fashion.  I made a small incision just below the umbilicus with a scalpel.  I dissected down to the fascia which was then up with a scalpel.  Hemostat was then used to pass into the perineal cavity under direct vision.  A 0 Vicryl pursestring suture was placed around the fascial opening.  [facility] was placed to the opening and insufflation of the abdomen was begun.  A 5 mm port was then placed the patient's right upper quadrant and another in the left lower quadrant both under direct vision.  The patient had moderate adhesions from small bowel to the  abdominal wall which I took down with a lap scopic scissors.  If there were adhesions of small bowel to the right lower quadrant which I likewise took down with the lap scopic scissors.  I was then able to identify the cecum.  Small bowel was stuck in the retroperitoneum.  I was able to free this up bluntly.  I then identified what appeared to be the base of the appendix going down into the retroperitoneum with a small, hard calcified mass.  This fell into pieces as I tried to grasp it.  It appeared like the patient may have had a previous ruptured appendix.  I was able to finally identify the base of the appendix which appeared normal and I transected it with a laparoscopic GIA stapler.  I then removed the appendix piecemeal after taken down the mesentery with the harmonic scalpel.  At this point I thoroughly irrigated the abdomen with normal saline.  The appendiceal stump staple line appeared intact.  Hemostasis appeared to be achieved.  At this point all ports were removed under direct vision the abdomen was deflated.  The 0 Vicryl at the umbilicus was tied in place closing the fascial defect.  All incisions were then anesthetized with Marcaine and closed with 4-0 Monocryl subcuticular sutures.  Dermabond was then applied.  The patient tolerated the procedure well.  All counts were correct at the end of the procedure.  The patient was then extubated in the operating room and taken in a  stable condition to the recovery room.          William Dawson A   Date: 06/17/2018  Time: 8:50 AM

## 2018-06-18 ENCOUNTER — Encounter (HOSPITAL_COMMUNITY): Payer: Self-pay | Admitting: Surgery

## 2018-07-07 DIAGNOSIS — H401333 Pigmentary glaucoma, bilateral, severe stage: Secondary | ICD-10-CM | POA: Diagnosis not present

## 2018-07-07 DIAGNOSIS — Z79899 Other long term (current) drug therapy: Secondary | ICD-10-CM | POA: Diagnosis not present

## 2018-08-13 DIAGNOSIS — H401333 Pigmentary glaucoma, bilateral, severe stage: Secondary | ICD-10-CM | POA: Diagnosis not present

## 2018-11-18 DIAGNOSIS — H401333 Pigmentary glaucoma, bilateral, severe stage: Secondary | ICD-10-CM | POA: Diagnosis not present

## 2018-11-30 DIAGNOSIS — E038 Other specified hypothyroidism: Secondary | ICD-10-CM | POA: Diagnosis not present

## 2018-11-30 DIAGNOSIS — R82998 Other abnormal findings in urine: Secondary | ICD-10-CM | POA: Diagnosis not present

## 2018-11-30 DIAGNOSIS — Z125 Encounter for screening for malignant neoplasm of prostate: Secondary | ICD-10-CM | POA: Diagnosis not present

## 2018-11-30 DIAGNOSIS — E119 Type 2 diabetes mellitus without complications: Secondary | ICD-10-CM | POA: Diagnosis not present

## 2018-11-30 DIAGNOSIS — Z0000 Encounter for general adult medical examination without abnormal findings: Secondary | ICD-10-CM | POA: Diagnosis not present

## 2018-12-07 DIAGNOSIS — G4733 Obstructive sleep apnea (adult) (pediatric): Secondary | ICD-10-CM | POA: Diagnosis not present

## 2018-12-07 DIAGNOSIS — Z1331 Encounter for screening for depression: Secondary | ICD-10-CM | POA: Diagnosis not present

## 2018-12-07 DIAGNOSIS — E038 Other specified hypothyroidism: Secondary | ICD-10-CM | POA: Diagnosis not present

## 2018-12-07 DIAGNOSIS — J449 Chronic obstructive pulmonary disease, unspecified: Secondary | ICD-10-CM | POA: Diagnosis not present

## 2018-12-07 DIAGNOSIS — Z0000 Encounter for general adult medical examination without abnormal findings: Secondary | ICD-10-CM | POA: Diagnosis not present

## 2018-12-07 DIAGNOSIS — E7800 Pure hypercholesterolemia, unspecified: Secondary | ICD-10-CM | POA: Diagnosis not present

## 2019-03-08 DIAGNOSIS — H401333 Pigmentary glaucoma, bilateral, severe stage: Secondary | ICD-10-CM | POA: Diagnosis not present

## 2019-05-23 DIAGNOSIS — J309 Allergic rhinitis, unspecified: Secondary | ICD-10-CM | POA: Diagnosis not present

## 2019-05-23 DIAGNOSIS — R05 Cough: Secondary | ICD-10-CM | POA: Diagnosis not present

## 2019-05-23 DIAGNOSIS — Z20818 Contact with and (suspected) exposure to other bacterial communicable diseases: Secondary | ICD-10-CM | POA: Diagnosis not present

## 2019-05-23 DIAGNOSIS — J019 Acute sinusitis, unspecified: Secondary | ICD-10-CM | POA: Diagnosis not present

## 2019-05-23 DIAGNOSIS — J449 Chronic obstructive pulmonary disease, unspecified: Secondary | ICD-10-CM | POA: Diagnosis not present

## 2019-06-10 DIAGNOSIS — G4733 Obstructive sleep apnea (adult) (pediatric): Secondary | ICD-10-CM | POA: Diagnosis not present

## 2019-06-10 DIAGNOSIS — E7800 Pure hypercholesterolemia, unspecified: Secondary | ICD-10-CM | POA: Diagnosis not present

## 2019-06-10 DIAGNOSIS — J449 Chronic obstructive pulmonary disease, unspecified: Secondary | ICD-10-CM | POA: Diagnosis not present

## 2019-06-10 DIAGNOSIS — E039 Hypothyroidism, unspecified: Secondary | ICD-10-CM | POA: Diagnosis not present

## 2019-07-13 DIAGNOSIS — Z79899 Other long term (current) drug therapy: Secondary | ICD-10-CM | POA: Diagnosis not present

## 2019-07-13 DIAGNOSIS — H401333 Pigmentary glaucoma, bilateral, severe stage: Secondary | ICD-10-CM | POA: Diagnosis not present

## 2019-07-19 DIAGNOSIS — E119 Type 2 diabetes mellitus without complications: Secondary | ICD-10-CM | POA: Diagnosis not present

## 2019-11-17 DIAGNOSIS — H401333 Pigmentary glaucoma, bilateral, severe stage: Secondary | ICD-10-CM | POA: Diagnosis not present

## 2019-12-06 DIAGNOSIS — Z0000 Encounter for general adult medical examination without abnormal findings: Secondary | ICD-10-CM | POA: Diagnosis not present

## 2019-12-06 DIAGNOSIS — E119 Type 2 diabetes mellitus without complications: Secondary | ICD-10-CM | POA: Diagnosis not present

## 2019-12-06 DIAGNOSIS — E039 Hypothyroidism, unspecified: Secondary | ICD-10-CM | POA: Diagnosis not present

## 2019-12-06 DIAGNOSIS — E7800 Pure hypercholesterolemia, unspecified: Secondary | ICD-10-CM | POA: Diagnosis not present

## 2019-12-06 DIAGNOSIS — Z125 Encounter for screening for malignant neoplasm of prostate: Secondary | ICD-10-CM | POA: Diagnosis not present

## 2019-12-13 DIAGNOSIS — Z0000 Encounter for general adult medical examination without abnormal findings: Secondary | ICD-10-CM | POA: Diagnosis not present

## 2019-12-13 DIAGNOSIS — G4733 Obstructive sleep apnea (adult) (pediatric): Secondary | ICD-10-CM | POA: Diagnosis not present

## 2019-12-13 DIAGNOSIS — E039 Hypothyroidism, unspecified: Secondary | ICD-10-CM | POA: Diagnosis not present

## 2019-12-13 DIAGNOSIS — Z1331 Encounter for screening for depression: Secondary | ICD-10-CM | POA: Diagnosis not present

## 2019-12-13 DIAGNOSIS — E7800 Pure hypercholesterolemia, unspecified: Secondary | ICD-10-CM | POA: Diagnosis not present

## 2019-12-13 DIAGNOSIS — J449 Chronic obstructive pulmonary disease, unspecified: Secondary | ICD-10-CM | POA: Diagnosis not present

## 2020-03-14 DIAGNOSIS — H401333 Pigmentary glaucoma, bilateral, severe stage: Secondary | ICD-10-CM | POA: Diagnosis not present

## 2020-10-16 DIAGNOSIS — H18832 Recurrent erosion of cornea, left eye: Secondary | ICD-10-CM | POA: Diagnosis not present

## 2020-10-19 DIAGNOSIS — H401333 Pigmentary glaucoma, bilateral, severe stage: Secondary | ICD-10-CM | POA: Diagnosis not present

## 2020-12-06 DIAGNOSIS — Z0000 Encounter for general adult medical examination without abnormal findings: Secondary | ICD-10-CM | POA: Diagnosis not present

## 2020-12-06 DIAGNOSIS — E039 Hypothyroidism, unspecified: Secondary | ICD-10-CM | POA: Diagnosis not present

## 2020-12-06 DIAGNOSIS — E7800 Pure hypercholesterolemia, unspecified: Secondary | ICD-10-CM | POA: Diagnosis not present

## 2020-12-06 DIAGNOSIS — Z125 Encounter for screening for malignant neoplasm of prostate: Secondary | ICD-10-CM | POA: Diagnosis not present

## 2020-12-06 DIAGNOSIS — R7302 Impaired glucose tolerance (oral): Secondary | ICD-10-CM | POA: Diagnosis not present

## 2020-12-13 DIAGNOSIS — R82998 Other abnormal findings in urine: Secondary | ICD-10-CM | POA: Diagnosis not present

## 2020-12-13 DIAGNOSIS — Z1339 Encounter for screening examination for other mental health and behavioral disorders: Secondary | ICD-10-CM | POA: Diagnosis not present

## 2020-12-13 DIAGNOSIS — R7302 Impaired glucose tolerance (oral): Secondary | ICD-10-CM | POA: Diagnosis not present

## 2020-12-13 DIAGNOSIS — Z1331 Encounter for screening for depression: Secondary | ICD-10-CM | POA: Diagnosis not present

## 2020-12-13 DIAGNOSIS — E7800 Pure hypercholesterolemia, unspecified: Secondary | ICD-10-CM | POA: Diagnosis not present

## 2020-12-13 DIAGNOSIS — Z0000 Encounter for general adult medical examination without abnormal findings: Secondary | ICD-10-CM | POA: Diagnosis not present

## 2021-04-17 DIAGNOSIS — H401333 Pigmentary glaucoma, bilateral, severe stage: Secondary | ICD-10-CM | POA: Diagnosis not present

## 2021-04-25 DIAGNOSIS — H0289 Other specified disorders of eyelid: Secondary | ICD-10-CM | POA: Diagnosis not present

## 2021-06-03 DIAGNOSIS — L821 Other seborrheic keratosis: Secondary | ICD-10-CM | POA: Diagnosis not present

## 2021-06-17 DIAGNOSIS — R7302 Impaired glucose tolerance (oral): Secondary | ICD-10-CM | POA: Diagnosis not present

## 2021-06-17 DIAGNOSIS — E039 Hypothyroidism, unspecified: Secondary | ICD-10-CM | POA: Diagnosis not present

## 2021-06-17 DIAGNOSIS — E7800 Pure hypercholesterolemia, unspecified: Secondary | ICD-10-CM | POA: Diagnosis not present

## 2021-08-21 DIAGNOSIS — H401333 Pigmentary glaucoma, bilateral, severe stage: Secondary | ICD-10-CM | POA: Diagnosis not present

## 2021-08-21 DIAGNOSIS — Z79899 Other long term (current) drug therapy: Secondary | ICD-10-CM | POA: Diagnosis not present

## 2021-12-12 DIAGNOSIS — E7800 Pure hypercholesterolemia, unspecified: Secondary | ICD-10-CM | POA: Diagnosis not present

## 2021-12-12 DIAGNOSIS — E039 Hypothyroidism, unspecified: Secondary | ICD-10-CM | POA: Diagnosis not present

## 2021-12-12 DIAGNOSIS — R7302 Impaired glucose tolerance (oral): Secondary | ICD-10-CM | POA: Diagnosis not present

## 2021-12-12 DIAGNOSIS — Z125 Encounter for screening for malignant neoplasm of prostate: Secondary | ICD-10-CM | POA: Diagnosis not present

## 2021-12-18 DIAGNOSIS — H401333 Pigmentary glaucoma, bilateral, severe stage: Secondary | ICD-10-CM | POA: Diagnosis not present

## 2021-12-19 DIAGNOSIS — Z0000 Encounter for general adult medical examination without abnormal findings: Secondary | ICD-10-CM | POA: Diagnosis not present

## 2021-12-19 DIAGNOSIS — R82998 Other abnormal findings in urine: Secondary | ICD-10-CM | POA: Diagnosis not present

## 2021-12-19 DIAGNOSIS — Z1339 Encounter for screening examination for other mental health and behavioral disorders: Secondary | ICD-10-CM | POA: Diagnosis not present

## 2021-12-19 DIAGNOSIS — R7302 Impaired glucose tolerance (oral): Secondary | ICD-10-CM | POA: Diagnosis not present

## 2021-12-19 DIAGNOSIS — Z1331 Encounter for screening for depression: Secondary | ICD-10-CM | POA: Diagnosis not present

## 2022-05-21 DIAGNOSIS — Z79899 Other long term (current) drug therapy: Secondary | ICD-10-CM | POA: Diagnosis not present

## 2022-05-21 DIAGNOSIS — H401333 Pigmentary glaucoma, bilateral, severe stage: Secondary | ICD-10-CM | POA: Diagnosis not present

## 2022-07-09 DIAGNOSIS — E039 Hypothyroidism, unspecified: Secondary | ICD-10-CM | POA: Diagnosis not present

## 2022-07-09 DIAGNOSIS — R7302 Impaired glucose tolerance (oral): Secondary | ICD-10-CM | POA: Diagnosis not present

## 2022-07-09 DIAGNOSIS — E7800 Pure hypercholesterolemia, unspecified: Secondary | ICD-10-CM | POA: Diagnosis not present

## 2022-12-24 DIAGNOSIS — E039 Hypothyroidism, unspecified: Secondary | ICD-10-CM | POA: Diagnosis not present

## 2022-12-24 DIAGNOSIS — E119 Type 2 diabetes mellitus without complications: Secondary | ICD-10-CM | POA: Diagnosis not present

## 2022-12-24 DIAGNOSIS — Z125 Encounter for screening for malignant neoplasm of prostate: Secondary | ICD-10-CM | POA: Diagnosis not present

## 2022-12-24 DIAGNOSIS — N529 Male erectile dysfunction, unspecified: Secondary | ICD-10-CM | POA: Diagnosis not present

## 2022-12-24 DIAGNOSIS — E7800 Pure hypercholesterolemia, unspecified: Secondary | ICD-10-CM | POA: Diagnosis not present

## 2023-02-03 DIAGNOSIS — Z1339 Encounter for screening examination for other mental health and behavioral disorders: Secondary | ICD-10-CM | POA: Diagnosis not present

## 2023-02-03 DIAGNOSIS — Z0000 Encounter for general adult medical examination without abnormal findings: Secondary | ICD-10-CM | POA: Diagnosis not present

## 2023-02-03 DIAGNOSIS — Z1331 Encounter for screening for depression: Secondary | ICD-10-CM | POA: Diagnosis not present

## 2023-02-03 DIAGNOSIS — E119 Type 2 diabetes mellitus without complications: Secondary | ICD-10-CM | POA: Diagnosis not present

## 2023-02-11 DIAGNOSIS — H401333 Pigmentary glaucoma, bilateral, severe stage: Secondary | ICD-10-CM | POA: Diagnosis not present

## 2023-02-11 DIAGNOSIS — H4311 Vitreous hemorrhage, right eye: Secondary | ICD-10-CM | POA: Diagnosis not present

## 2023-05-19 DIAGNOSIS — E039 Hypothyroidism, unspecified: Secondary | ICD-10-CM | POA: Diagnosis not present

## 2023-05-19 DIAGNOSIS — R002 Palpitations: Secondary | ICD-10-CM | POA: Diagnosis not present

## 2023-05-19 DIAGNOSIS — E119 Type 2 diabetes mellitus without complications: Secondary | ICD-10-CM | POA: Diagnosis not present

## 2023-05-26 DIAGNOSIS — L821 Other seborrheic keratosis: Secondary | ICD-10-CM | POA: Diagnosis not present

## 2023-05-26 DIAGNOSIS — C44519 Basal cell carcinoma of skin of other part of trunk: Secondary | ICD-10-CM | POA: Diagnosis not present

## 2023-05-26 DIAGNOSIS — D225 Melanocytic nevi of trunk: Secondary | ICD-10-CM | POA: Diagnosis not present

## 2023-05-26 DIAGNOSIS — Z85828 Personal history of other malignant neoplasm of skin: Secondary | ICD-10-CM | POA: Diagnosis not present

## 2023-05-26 DIAGNOSIS — L570 Actinic keratosis: Secondary | ICD-10-CM | POA: Diagnosis not present

## 2023-05-26 DIAGNOSIS — C4441 Basal cell carcinoma of skin of scalp and neck: Secondary | ICD-10-CM | POA: Diagnosis not present

## 2023-05-28 DIAGNOSIS — H401333 Pigmentary glaucoma, bilateral, severe stage: Secondary | ICD-10-CM | POA: Diagnosis not present

## 2023-05-29 ENCOUNTER — Ambulatory Visit: Payer: BC Managed Care – PPO | Admitting: Cardiology

## 2023-05-29 ENCOUNTER — Encounter: Payer: Self-pay | Admitting: Cardiology

## 2023-05-29 ENCOUNTER — Other Ambulatory Visit: Payer: BC Managed Care – PPO

## 2023-05-29 VITALS — BP 143/82 | HR 60 | Resp 16 | Ht 68.0 in | Wt 196.4 lb

## 2023-05-29 DIAGNOSIS — E781 Pure hyperglyceridemia: Secondary | ICD-10-CM | POA: Diagnosis not present

## 2023-05-29 DIAGNOSIS — R002 Palpitations: Secondary | ICD-10-CM | POA: Diagnosis not present

## 2023-05-29 DIAGNOSIS — E119 Type 2 diabetes mellitus without complications: Secondary | ICD-10-CM | POA: Diagnosis not present

## 2023-05-29 DIAGNOSIS — E782 Mixed hyperlipidemia: Secondary | ICD-10-CM

## 2023-05-29 DIAGNOSIS — G4730 Sleep apnea, unspecified: Secondary | ICD-10-CM

## 2023-05-29 DIAGNOSIS — J449 Chronic obstructive pulmonary disease, unspecified: Secondary | ICD-10-CM

## 2023-05-29 DIAGNOSIS — E039 Hypothyroidism, unspecified: Secondary | ICD-10-CM

## 2023-05-29 NOTE — Progress Notes (Signed)
ID:  William Dawson, DOB 10-30-1945, MRN [id number]  PCP:  Gaspar Garbe, MD  Cardiologist:  Tessa Lerner, DO, [facility] (established care 05/29/23)  REASON FOR CONSULT: Palpitations  REQUESTING PHYSICIAN:  Tisovec, Adelfa Koh, MD [address] [place],  [place] [address]  Chief Complaint  Patient presents with   Palpitations   New Patient (Initial Visit)    HPI  William Dawson is a 77 y.o. Caucasian male who presents to the clinic for evaluation of palpitations at the request of Tisovec, Adelfa Koh, MD. His past medical history and cardiovascular risk factors include: Hyperlipidemia, diabetes mellitus type 2, hypothyroidism, COPD, OSA, hypertension, history of tachycardia, former smoker.  Patient is referred to the practice for evaluation of palpitations.  He has had a history of tachycardia going back to the age of 73 and has seen cardiology in the past back in [place] and after workup was placed on atenolol which she continues.  Recently he has been noticing palpitations/skipped beats more pronounced in the evening hours but resolves by the time he falls asleep.  When present the last for minutes, intermittent, no associated near-syncope or syncopal events.  At times associated with shortness of breath but not always.  He tries to cough when this happens which helps but does not resolve symptoms.  No history of anemia. Currently taking Synthroid regularly without fail. No consumption of coffee, caffeinated beverages, illicit drugs, weight loss supplements, stimulants. Drinks about 10 ounces of soda which is caffeine free a day.  FUNCTIONAL STATUS: Walking at work regularly.    CARDIAC DATABASE: EKG: 05/29/2023: Sinus bradycardia, 56 bpm, RBBB, left axis, left anterior fascicular block, LVH per voltage criteria, diffuse ST-T changes likely secondary to strain pattern or RBBB but ischemia cannot be ruled out.  Echocardiogram: No results found for this or any previous visit from  the past 1095 days.    ALLERGIES: No Known Allergies  MEDICATION LIST PRIOR TO VISIT: Current Meds  Medication Sig   atenolol (TENORMIN) 50 MG tablet Take 50 mg by mouth daily.   budesonide-formoterol (SYMBICORT) 160-4.5 MCG/ACT inhaler Inhale 2 puffs into the lungs 2 (two) times daily.   Choline Fenofibrate (FENOFIBRIC ACID) 135 MG CPDR Take 135 mg by mouth daily.   JENTADUETO XR 02-999 MG TB24 1 tablet with a meal Orally Once a day for 90 days   levothyroxine (SYNTHROID, LEVOTHROID) 88 MCG tablet Take 88 mcg by mouth daily before breakfast.   loratadine (CLARITIN) 10 MG tablet Take 10 mg by mouth daily.   rosuvastatin (CRESTOR) 10 MG tablet Take 10 mg by mouth daily.   [DISCONTINUED] Choline Fenofibrate 135 MG capsule Take 135 mg by mouth daily.     PAST MEDICAL HISTORY: Past Medical History:  Diagnosis Date   Bell's palsy 2015 ish   Complication of anesthesia    COPD (chronic obstructive pulmonary disease) (HCC)    Diabetes mellitus without complication (HCC)    Type II   Hypothyroidism    PONV (postoperative nausea and vomiting)    x 1 witrh eye surgery   Tachycardia     PAST SURGICAL HISTORY: Past Surgical History:  Procedure Laterality Date   COLONOSCOPY     x 2 no polyps   COLONOSCOPY W/ POLYPECTOMY     DENTAL SURGERY     EYE SURGERY Bilateral    cataract   LAPAROSCOPIC APPENDECTOMY N/A 06/17/2018   Procedure: LAPAROSCOPIC APPENDECTOMY;  Surgeon: Abigail Miyamoto, MD;  Location: [facility] OR;  Service: General;  Laterality:  N/A;   Needle aspiration of abdomen fluid Right    not a cyst - 2 times    FAMILY HISTORY: The patient family history includes Colon cancer in his mother; Stroke in his father.  SOCIAL HISTORY:  The patient  reports that he quit smoking about 43 years ago. His smoking use included cigarettes. He has never used smokeless tobacco. He reports that he does not use drugs.  REVIEW OF SYSTEMS: Review of Systems  Cardiovascular:  Positive for  palpitations. Negative for chest pain, claudication, dyspnea on exertion, irregular heartbeat, leg swelling, near-syncope, orthopnea, paroxysmal nocturnal dyspnea and syncope.  Respiratory:  Negative for shortness of breath.   Hematologic/Lymphatic: Negative for bleeding problem.  Musculoskeletal:  Negative for muscle cramps and myalgias.  Neurological:  Negative for dizziness and light-headedness.    PHYSICAL EXAM:    05/29/2023   10:28 AM 06/17/2018   10:29 AM 06/17/2018   10:23 AM  Vitals with BMI  Height 5\' 8"     Weight 196 lbs 6 oz    BMI 29.87    Systolic 143 114 191  Diastolic 82 74 73  Pulse 60 59 59    Physical Exam  Constitutional: No distress.  Age appropriate, hemodynamically stable.   Neck: No JVD present.  Cardiovascular: Normal rate, regular rhythm, S1 normal, S2 normal, intact distal pulses and normal pulses. Exam reveals no gallop, no S3 and no S4.  No murmur heard. Pulmonary/Chest: Effort normal and breath sounds normal. No stridor. He has no wheezes. He has no rales.  Abdominal: Soft. Bowel sounds are normal. He exhibits no distension. There is no abdominal tenderness.  Musculoskeletal:        General: No edema.     Cervical back: Neck supple.  Neurological: He is alert and oriented to person, place, and time. He has intact cranial nerves (2-12).  Skin: Skin is warm and moist.   LABORATORY DATA:    Latest Ref Rng & Units 06/03/2018    4:06 PM 04/27/2009    7:30 AM 04/01/2007    8:49 AM  CBC  WBC 4.0 - 10.5 K/uL 10.3  9.2  8.8   Hemoglobin 13.0 - 17.0 g/dL 47.8  29.5  62.1   Hematocrit 39.0 - 52.0 % 50.5  47.1  47.2   Platelets 150 - 400 K/uL 263  314  321        Latest Ref Rng & Units 06/03/2018    4:06 PM  CMP  Glucose 70 - 99 mg/dL 308   BUN 8 - 23 mg/dL 22   Creatinine 6.57 - 1.24 mg/dL 8.46   Sodium 962 - 952 mmol/L 142   Potassium 3.5 - 5.1 mmol/L 4.1   Chloride 98 - 111 mmol/L 109   CO2 22 - 32 mmol/L 24   Calcium 8.9 - 10.3 mg/dL 9.6      No results found for: "CHOL", "HDL", "LDLCALC", "LDLDIRECT", "TRIG", "CHOLHDL" No components found for: "NTPROBNP" No results for input(s): "PROBNP" in the last 8760 hours. No results for input(s): "TSH" in the last 8760 hours.  BMP No results for input(s): "NA", "K", "CL", "CO2", "GLUCOSE", "BUN", "CREATININE", "CALCIUM", "GFRNONAA", "GFRAA" in the last 8760 hours.  HEMOGLOBIN A1C No results found for: "HGBA1C", "MPG"  External Labs: Collected: January 01, 2023. TSH 5.56. Total cholesterol 138, triglycerides 297, HDL 23, LDL 56, non-HDL 115. A1c 6.5  IMPRESSION:    ICD-10-CM   1. Palpitations  R00.2 EKG 12-Lead    LONG TERM MONITOR (3-14  DAYS)    2. Mixed hyperlipidemia  E78.2 CT CARDIAC SCORING (SELF PAY ONLY)    PCV ECHOCARDIOGRAM COMPLETE    3. Non-insulin dependent type 2 diabetes mellitus (HCC)  E11.9 CT CARDIAC SCORING (SELF PAY ONLY)    PCV ECHOCARDIOGRAM COMPLETE    4. Pure hypertriglyceridemia  E78.1 CT CARDIAC SCORING (SELF PAY ONLY)    PCV ECHOCARDIOGRAM COMPLETE    5. Sleep apnea in adult  G47.30     6. Hypothyroidism, unspecified type  E03.9     7. Chronic obstructive pulmonary disease, unspecified COPD type (HCC)  J44.9        RECOMMENDATIONS: William Dawson is a 77 y.o. Caucasian male whose past medical history and cardiac risk factors include: Hyperlipidemia, diabetes mellitus type 2, hypothyroidism, COPD, OSA, hypertension, history of tachycardia, former smoker.  Palpitations Present despite being on atenolol. History of tachycardia since the age of 56. EKG shows sinus rhythm with right bundle and left anterior fascicular block. 2 weeks Zio patch to evaluate for dysrhythmias.  Non-insulin dependent type 2 diabetes mellitus (HCC) Mixed hyperlipidemia Pure hypertriglyceridemia Currently on currently on fenofibrate and rosuvastatin.   He denies myalgia or other side effects. Most recent lipids dated March 2024, independently reviewed as noted  above. Currently managed by primary care provider. Will proceed with coronary artery calcification scoring for further risk stratification for CAD  Sleep apnea in adult Currently not on CPAP. Reemphasized importance of device compliance.  Hypothyroidism, unspecified type Currently on medical therapy. Currently managed by primary care provider.  FINAL MEDICATION LIST END OF ENCOUNTER: No orders of the defined types were placed in this encounter.   Medications Discontinued During This Encounter  Medication Reason   oxyCODONE (OXY IR/ROXICODONE) 5 MG immediate release tablet    Saxagliptin-Metformin (KOMBIGLYZE XR) 02-999 MG TB24    Choline Fenofibrate 135 MG capsule      Current Outpatient Medications:    atenolol (TENORMIN) 50 MG tablet, Take 50 mg by mouth daily., Disp: , Rfl:    budesonide-formoterol (SYMBICORT) 160-4.5 MCG/ACT inhaler, Inhale 2 puffs into the lungs 2 (two) times daily., Disp: , Rfl:    Choline Fenofibrate (FENOFIBRIC ACID) 135 MG CPDR, Take 135 mg by mouth daily., Disp: , Rfl:    JENTADUETO XR 02-999 MG TB24, 1 tablet with a meal Orally Once a day for 90 days, Disp: , Rfl:    levothyroxine (SYNTHROID, LEVOTHROID) 88 MCG tablet, Take 88 mcg by mouth daily before breakfast., Disp: , Rfl:    loratadine (CLARITIN) 10 MG tablet, Take 10 mg by mouth daily., Disp: , Rfl:    rosuvastatin (CRESTOR) 10 MG tablet, Take 10 mg by mouth daily., Disp: , Rfl:   Orders Placed This Encounter  Procedures   CT CARDIAC SCORING (SELF PAY ONLY)   LONG TERM MONITOR (3-14 DAYS)   EKG 12-Lead   PCV ECHOCARDIOGRAM COMPLETE    There are no Patient Instructions on file for this visit.   --Continue cardiac medications as reconciled in final medication list. --Return in about 6 weeks (around 07/10/2023) for Follow up, Palpitations. or sooner if needed. --Continue follow-up with your primary care physician regarding the management of your other chronic comorbid conditions.  Patient's  questions and concerns were addressed to his satisfaction. He voices understanding of the instructions provided during this encounter.   This note was created using a voice recognition software as a result there may be grammatical errors inadvertently enclosed that do not reflect the nature of this encounter. Every attempt  is made to correct such errors.  Tessa Lerner, [place], [facility]  Pager:  [phone] Office: [phone]

## 2023-06-24 DIAGNOSIS — R002 Palpitations: Secondary | ICD-10-CM | POA: Diagnosis not present

## 2023-06-25 DIAGNOSIS — R002 Palpitations: Secondary | ICD-10-CM | POA: Diagnosis not present

## 2023-06-26 NOTE — Progress Notes (Signed)
2nd attempt : Called patient, NA, LMAM

## 2023-06-26 NOTE — Progress Notes (Signed)
Called patient no answer left a vm

## 2023-07-03 ENCOUNTER — Ambulatory Visit (HOSPITAL_BASED_OUTPATIENT_CLINIC_OR_DEPARTMENT_OTHER)
Admission: RE | Admit: 2023-07-03 | Discharge: 2023-07-03 | Disposition: A | Discharge status: Home / Self Care | Payer: BLUE CROSS/BLUE SHIELD | Source: Ambulatory Visit | Attending: Cardiology | Admitting: Cardiology

## 2023-07-03 DIAGNOSIS — E782 Mixed hyperlipidemia: Secondary | ICD-10-CM | POA: Insufficient documentation

## 2023-07-03 DIAGNOSIS — E781 Pure hyperglyceridemia: Secondary | ICD-10-CM | POA: Insufficient documentation

## 2023-07-03 DIAGNOSIS — E119 Type 2 diabetes mellitus without complications: Secondary | ICD-10-CM | POA: Insufficient documentation

## 2023-07-07 ENCOUNTER — Ambulatory Visit: Payer: BC Managed Care – PPO | Admitting: Cardiology

## 2023-07-09 ENCOUNTER — Ambulatory Visit (HOSPITAL_COMMUNITY): Payer: BC Managed Care – PPO | Attending: Cardiology

## 2023-07-09 DIAGNOSIS — E119 Type 2 diabetes mellitus without complications: Secondary | ICD-10-CM

## 2023-07-09 DIAGNOSIS — I471 Supraventricular tachycardia, unspecified: Secondary | ICD-10-CM | POA: Diagnosis not present

## 2023-07-09 DIAGNOSIS — E782 Mixed hyperlipidemia: Secondary | ICD-10-CM

## 2023-07-09 DIAGNOSIS — E781 Pure hyperglyceridemia: Secondary | ICD-10-CM | POA: Diagnosis not present

## 2023-07-11 LAB — ECHOCARDIOGRAM COMPLETE
Area-P 1/2: 2.34 cm2
S' Lateral: 2.5 cm

## 2023-07-13 ENCOUNTER — Ambulatory Visit: Payer: BC Managed Care – PPO | Attending: Cardiology | Admitting: Cardiology

## 2023-07-13 ENCOUNTER — Encounter: Payer: Self-pay | Admitting: Cardiology

## 2023-07-13 VITALS — BP 140/78 | HR 67 | Resp 16 | Ht 68.0 in | Wt 193.6 lb

## 2023-07-13 DIAGNOSIS — I471 Supraventricular tachycardia, unspecified: Secondary | ICD-10-CM

## 2023-07-13 DIAGNOSIS — R002 Palpitations: Secondary | ICD-10-CM

## 2023-07-13 DIAGNOSIS — E119 Type 2 diabetes mellitus without complications: Secondary | ICD-10-CM | POA: Diagnosis not present

## 2023-07-13 DIAGNOSIS — E782 Mixed hyperlipidemia: Secondary | ICD-10-CM

## 2023-07-13 DIAGNOSIS — E781 Pure hyperglyceridemia: Secondary | ICD-10-CM | POA: Diagnosis not present

## 2023-07-13 DIAGNOSIS — E039 Hypothyroidism, unspecified: Secondary | ICD-10-CM

## 2023-07-13 DIAGNOSIS — G4730 Sleep apnea, unspecified: Secondary | ICD-10-CM

## 2023-07-13 MED ORDER — DILTIAZEM HCL ER COATED BEADS 180 MG PO CP24: 180.0000 mg | ORAL_CAPSULE | Freq: Every day | ORAL | 0 refills | Status: DC

## 2023-07-13 NOTE — Patient Instructions (Signed)
Medication Instructions:  STOP Atenolol  START Diltiazem 180 CD once daily  *If you need a refill on your cardiac medications before your next appointment, please call your pharmacy*   Follow-Up: At [facility], you and your health needs are our priority.  As part of our continuing mission to provide you with exceptional heart care, we have created designated Provider Care Teams.  These Care Teams include your primary Cardiologist (physician) and Advanced Practice Providers (APPs -  Physician Assistants and Nurse Practitioners) who all work together to provide you with the care you need, when you need it.  We recommend signing up for the patient portal called "MyChart".  Sign up information is provided on this After Visit Summary.  MyChart is used to connect with patients for Virtual Visits (Telemedicine).  Patients are able to view lab/test results, encounter notes, upcoming appointments, etc.  Non-urgent messages can be sent to your provider as well.   To learn more about what you can do with MyChart, go to [email or web address].    Your next appointment:   6 month(s)  Provider:   Dr. Odis Hollingshead

## 2023-07-13 NOTE — Progress Notes (Signed)
Cardiology Office Note:  .   Date:  07/13/2023  ID:  William Dawson, DOB [birth year], MRN [id number] PCP:  Tessa Lerner, DO  Former Cardiology Providers: NA  HeartCare Providers Cardiologist:  None , [facility] (established care 05/29/23) Electrophysiologist:  None  Click to update primary MD,subspecialty MD or APP then REFRESH:1}    History of Present Illness: .   William Dawson is a 77 y.o. Caucasian male whose past medical history and cardiovascular risk factors includes: Hyperlipidemia, diabetes mellitus type 2, hypothyroidism, COPD, OSA, hypertension, history of tachycardia, former smoker.   Patient is referred to the practice for evaluation of palpitations.  He has a history of tachycardia going back to the age of 77 and was worked up by general cardiology in [facility] and was placed on atenolol which he has continued since he was in his 33s.  However due to increased symptoms he was referred back to cardiology for further evaluation and management.  Since last office visit patient states that he has been having intermittent episodes of palpitations which are short-lived but sporadic during the day.  Results of the Zio patch, coronary calcium score, and echocardiogram independently reviewed with him at today's office visit and noted below for further reference.  Review of Systems: .   Review of Systems  Cardiovascular:  Positive for palpitations (Chronic and stable). Negative for chest pain, claudication, dyspnea on exertion, irregular heartbeat, leg swelling, near-syncope, orthopnea, paroxysmal nocturnal dyspnea and syncope.  Respiratory:  Negative for shortness of breath.   Hematologic/Lymphatic: Negative for bleeding problem.  Musculoskeletal:  Negative for muscle cramps and myalgias.  Neurological:  Negative for dizziness and light-headedness.    Studies Reviewed:   EKG: 05/29/2023: Sinus bradycardia, 56 bpm, RBBB, left axis, left anterior fascicular block, LVH  per voltage criteria, diffuse ST-T changes likely secondary to strain pattern or RBBB but ischemia cannot be ruled out.   Echocardiogram: 07/09/2023 1. Left ventricular ejection fraction, by estimation, is 60 to 65%. Left ventricular ejection fraction by 3D volume is 57 %. The left ventricle has normal function. The left ventricle has no regional wall motion abnormalities. There is mild left ventricular hypertrophy. Left ventricular diastolic parameters are consistent with Grade I diastolic dysfunction (impaired relaxation). 2. Right ventricular systolic function is mildly reduced at the RV apex. The right ventricular size is normal. There is normal pulmonary artery systolic pressure. The estimated right ventricular systolic pressure is 26.2 mmHg. 3. The mitral valve is normal in structure. Trivial mitral valve regurgitation. No evidence of mitral stenosis. 4. The aortic valve is tricuspid. There is mild calcification of the aortic valve. There is mild thickening of the aortic valve. Aortic valve regurgitation is not visualized. No aortic stenosis is present. 5. The inferior vena cava is normal in size with greater than 50% respiratory variability, suggesting right atrial pressure of 3 mmHg.  Coronary artery calcium score:  07/03/2023 Coronary calcium score of 0. This was 0 percentile for age-, race-, and sex-matched controls.  Cardiac monitor: Cardiac monitor (Zio Patch): 05/29/2023 - 06/12/2023 Dominant rhythm sinus rhythm, intermittently delta wave is seen. Heart rate 45-112 bpm. Avg HR 68 bpm. No atrial fibrillation detected during the monitoring period. No ventricular tachycardia, high grade AV block, pauses (3 seconds or longer). Total supraventricular ectopic burden 2.1% (predominantly isolated beats). Total ventricular ectopic burden <1%. Patient triggered events: 2 but no associated rhythm strips.   Risk Assessment/Calculations:   N/A   Labs:       Latest  Ref Rng & Units  06/03/2018    4:06 PM 04/27/2009    7:30 AM 04/01/2007    8:49 AM  CBC  WBC 4.0 - 10.5 K/uL 10.3  9.2  8.8   Hemoglobin 13.0 - 17.0 g/dL 16.1  09.6  04.5   Hematocrit 39.0 - 52.0 % 50.5  47.1  47.2   Platelets 150 - 400 K/uL 263  314  321        Latest Ref Rng & Units 06/03/2018    4:06 PM  BMP  Glucose 70 - 99 mg/dL 409   BUN 8 - 23 mg/dL 22   Creatinine 8.11 - 1.24 mg/dL 9.14   Sodium 782 - 956 mmol/L 142   Potassium 3.5 - 5.1 mmol/L 4.1   Chloride 98 - 111 mmol/L 109   CO2 22 - 32 mmol/L 24   Calcium 8.9 - 10.3 mg/dL 9.6       Latest Ref Rng & Units 06/03/2018    4:06 PM  CMP  Glucose 70 - 99 mg/dL 213   BUN 8 - 23 mg/dL 22   Creatinine 0.86 - 1.24 mg/dL 5.78   Sodium 469 - 629 mmol/L 142   Potassium 3.5 - 5.1 mmol/L 4.1   Chloride 98 - 111 mmol/L 109   CO2 22 - 32 mmol/L 24   Calcium 8.9 - 10.3 mg/dL 9.6     No results found for: "CHOL", "HDL", "LDLCALC", "LDLDIRECT", "TRIG", "CHOLHDL" No results for input(s): "LIPOA" in the last 8760 hours. No components found for: "NTPROBNP" No results for input(s): "PROBNP" in the last 8760 hours. No results for input(s): "TSH" in the last 8760 hours.  External Labs: Collected: January 01, 2023. TSH 5.56. Total cholesterol 138, triglycerides 297, HDL 23, LDL 56, non-HDL 115. A1c 6.5  Physical Exam:    Today's Vitals   07/13/23 1332  BP: (!) 140/78  Pulse: 67  Resp: 16  SpO2: 93%  Weight: 193 lb 9.6 oz (87.8 kg)  Height: 5\' 8"  (1.727 m)   Body mass index is 29.44 kg/m. Wt Readings from Last 3 Encounters:  07/13/23 193 lb 9.6 oz (87.8 kg)  05/29/23 196 lb 6.4 oz (89.1 kg)  06/03/18 198 lb 9.6 oz (90.1 kg)    Physical Exam   Impression & Recommendation(s):  Impression:   ICD-10-CM   1. Palpitations  R00.2 diltiazem (CARDIZEM CD) 180 MG 24 hr capsule    2. PSVT (paroxysmal supraventricular tachycardia) (HCC)  I47.10 diltiazem (CARDIZEM CD) 180 MG 24 hr capsule    3. Non-insulin dependent type 2 diabetes  mellitus (HCC)  E11.9     4. Pure hypertriglyceridemia  E78.1     5. Mixed hyperlipidemia  E78.2     6. Sleep apnea in adult  G47.30     7. Hypothyroidism, unspecified type  E03.9        Recommendation(s):  Palpitations PSVT (paroxysmal supraventricular tachycardia) (HCC) Has had palpitations since his 36s. Discontinue atenolol. Start diltiazem 180 mg p.o. daily.  Will give him 30 tablets for now to see how his symptoms are if needed we can always uptitrate the dose to alleviate his symptoms as long as blood pressure and pulse allows. No identifiable reversible causes. Results of Zio patch reviewed with him in detail noted above for further reference Monitor for now  Non-insulin dependent type 2 diabetes mellitus (HCC) Pure hypertriglyceridemia Mixed hyperlipidemia Patient understands importance of improving his modifiable cardiovascular risk factors. Lipids are well-controlled, LDL 56 mg/dL. Triglycerides are  not at goal 297 mg/dL likely secondary to increased carbohydrate intake on a daily basis, per patient He would like to implement lifestyle changes prior to considering up titration of pharmacological therapy.   He will follow-up with PCP. Coronary calcium score is 0.  Sleep apnea in adult Was diagnosed with sleep apnea in the past but currently not on CPAP. Is willing to be rechecked as significant amount of his PSVT episodes by hysterogram occur while asleep.   He is in talk to his wife first and will call us back if he wants to have a sleep study scheduled with [place].  Orders Placed:  No orders of the defined types were placed in this encounter.   As part of medical decision making results of the echo, coronary calcium score, Zio patch were reviewed independently at today's visit.   Final Medication List:    Meds ordered this encounter  Medications   diltiazem (CARDIZEM CD) 180 MG 24 hr capsule    Sig: Take 1 capsule (180 mg total) by mouth daily.    Dispense:   30 capsule    Refill:  0    Medications Discontinued During This Encounter  Medication Reason   atenolol (TENORMIN) 50 MG tablet Change in therapy     Current Outpatient Medications:    budesonide-formoterol (SYMBICORT) 160-4.5 MCG/ACT inhaler, Inhale 2 puffs into the lungs 2 (two) times daily., Disp: , Rfl:    Choline Fenofibrate (FENOFIBRIC ACID) 135 MG CPDR, Take 135 mg by mouth daily., Disp: , Rfl:    diltiazem (CARDIZEM CD) 180 MG 24 hr capsule, Take 1 capsule (180 mg total) by mouth daily., Disp: 30 capsule, Rfl: 0   JENTADUETO XR 02-999 MG TB24, 1 tablet with a meal Orally Once a day for 90 days, Disp: , Rfl:    levothyroxine (SYNTHROID, LEVOTHROID) 88 MCG tablet, Take 88 mcg by mouth daily before breakfast., Disp: , Rfl:    loratadine (CLARITIN) 10 MG tablet, Take 10 mg by mouth daily., Disp: , Rfl:    rosuvastatin (CRESTOR) 10 MG tablet, Take 10 mg by mouth daily., Disp: , Rfl:   Consent:      N/A  Disposition:   Return in about 6 months (around 01/11/2024) for Follow up, Palpitations, PSVT. or sooner if needed.  His questions and concerns were addressed to his satisfaction. He voices understanding of the recommendations provided during this encounter.    Signed, Tessa Lerner, DO, [facility]  [facility]  [address] [place], [place] [address] [phone] 07/13/2023 3:39 PM

## 2023-07-17 ENCOUNTER — Encounter: Payer: Self-pay | Admitting: Cardiology

## 2023-07-22 MED ORDER — ATENOLOL 50 MG PO TABS: 50.0000 mg | ORAL_TABLET | Freq: Every day | ORAL | 1 refills | Status: AC

## 2023-07-22 NOTE — Telephone Encounter (Signed)
Sorry to hear that.   Diltiazem should not causes that but since you did not do well with it just go back to Atenolol 50mg  po qday and update his medication list.   Dr. Odis Hollingshead

## 2023-07-22 NOTE — Telephone Encounter (Signed)
MyChart message sent to pt with Dr.Tolia's recommendations. Diltiazem order discontinued and new order for Atenolol 50 mg placed and sent to pt preferred pharmacy.

## 2023-08-26 ENCOUNTER — Encounter: Payer: Self-pay | Admitting: Cardiology

## 2023-09-01 DIAGNOSIS — E039 Hypothyroidism, unspecified: Secondary | ICD-10-CM | POA: Diagnosis not present

## 2023-09-01 DIAGNOSIS — E119 Type 2 diabetes mellitus without complications: Secondary | ICD-10-CM | POA: Diagnosis not present

## 2023-11-23 DIAGNOSIS — H401333 Pigmentary glaucoma, bilateral, severe stage: Secondary | ICD-10-CM | POA: Diagnosis not present

## 2023-12-31 ENCOUNTER — Ambulatory Visit: Payer: BC Managed Care – PPO | Attending: Cardiology | Admitting: Cardiology

## 2024-02-04 DIAGNOSIS — Z125 Encounter for screening for malignant neoplasm of prostate: Secondary | ICD-10-CM | POA: Diagnosis not present

## 2024-02-04 DIAGNOSIS — E7800 Pure hypercholesterolemia, unspecified: Secondary | ICD-10-CM | POA: Diagnosis not present

## 2024-02-04 DIAGNOSIS — E039 Hypothyroidism, unspecified: Secondary | ICD-10-CM | POA: Diagnosis not present

## 2024-02-04 DIAGNOSIS — E119 Type 2 diabetes mellitus without complications: Secondary | ICD-10-CM | POA: Diagnosis not present

## 2024-02-04 DIAGNOSIS — R972 Elevated prostate specific antigen [PSA]: Secondary | ICD-10-CM | POA: Diagnosis not present

## 2024-02-04 DIAGNOSIS — Z1212 Encounter for screening for malignant neoplasm of rectum: Secondary | ICD-10-CM | POA: Diagnosis not present

## 2024-02-07 DIAGNOSIS — Z1212 Encounter for screening for malignant neoplasm of rectum: Secondary | ICD-10-CM | POA: Diagnosis not present

## 2024-02-08 DIAGNOSIS — Z0000 Encounter for general adult medical examination without abnormal findings: Secondary | ICD-10-CM | POA: Diagnosis not present

## 2024-02-08 DIAGNOSIS — Z1339 Encounter for screening examination for other mental health and behavioral disorders: Secondary | ICD-10-CM | POA: Diagnosis not present

## 2024-02-08 DIAGNOSIS — R82998 Other abnormal findings in urine: Secondary | ICD-10-CM | POA: Diagnosis not present

## 2024-02-08 DIAGNOSIS — E119 Type 2 diabetes mellitus without complications: Secondary | ICD-10-CM | POA: Diagnosis not present

## 2024-02-08 DIAGNOSIS — Z1331 Encounter for screening for depression: Secondary | ICD-10-CM | POA: Diagnosis not present

## 2024-03-31 DIAGNOSIS — H401132 Primary open-angle glaucoma, bilateral, moderate stage: Secondary | ICD-10-CM | POA: Diagnosis not present

## 2024-04-27 DIAGNOSIS — H401333 Pigmentary glaucoma, bilateral, severe stage: Secondary | ICD-10-CM | POA: Diagnosis not present

## 2024-05-19 DIAGNOSIS — L309 Dermatitis, unspecified: Secondary | ICD-10-CM | POA: Diagnosis not present

## 2024-05-24 DIAGNOSIS — R0981 Nasal congestion: Secondary | ICD-10-CM | POA: Diagnosis not present

## 2024-05-24 DIAGNOSIS — U071 COVID-19: Secondary | ICD-10-CM | POA: Diagnosis not present

## 2024-05-24 DIAGNOSIS — R058 Other specified cough: Secondary | ICD-10-CM | POA: Diagnosis not present

## 2024-06-20 DIAGNOSIS — J069 Acute upper respiratory infection, unspecified: Secondary | ICD-10-CM | POA: Diagnosis not present

## 2024-06-20 DIAGNOSIS — J449 Chronic obstructive pulmonary disease, unspecified: Secondary | ICD-10-CM | POA: Diagnosis not present

## 2024-06-20 DIAGNOSIS — R058 Other specified cough: Secondary | ICD-10-CM | POA: Diagnosis not present

## 2024-07-12 DIAGNOSIS — Z1211 Encounter for screening for malignant neoplasm of colon: Secondary | ICD-10-CM | POA: Diagnosis not present

## 2024-07-12 DIAGNOSIS — K439 Ventral hernia without obstruction or gangrene: Secondary | ICD-10-CM | POA: Diagnosis not present

## 2024-07-12 DIAGNOSIS — Z800 Family history of malignant neoplasm of digestive organs: Secondary | ICD-10-CM | POA: Diagnosis not present

## 2024-07-12 DIAGNOSIS — K625 Hemorrhage of anus and rectum: Secondary | ICD-10-CM | POA: Diagnosis not present

## 2024-08-05 DIAGNOSIS — K921 Melena: Secondary | ICD-10-CM | POA: Diagnosis not present

## 2024-08-05 DIAGNOSIS — R195 Other fecal abnormalities: Secondary | ICD-10-CM | POA: Diagnosis not present

## 2024-08-05 DIAGNOSIS — K635 Polyp of colon: Secondary | ICD-10-CM | POA: Diagnosis not present

## 2024-08-05 DIAGNOSIS — Z1211 Encounter for screening for malignant neoplasm of colon: Secondary | ICD-10-CM | POA: Diagnosis not present

## 2024-08-05 DIAGNOSIS — Z800 Family history of malignant neoplasm of digestive organs: Secondary | ICD-10-CM | POA: Diagnosis not present

## 2024-08-05 DIAGNOSIS — Z860100 Personal history of colon polyps, unspecified: Secondary | ICD-10-CM | POA: Diagnosis not present

## 2024-08-31 DIAGNOSIS — H401333 Pigmentary glaucoma, bilateral, severe stage: Secondary | ICD-10-CM | POA: Diagnosis not present
